# Patient Record
Sex: Female | Born: 1945 | Race: Black or African American | Hispanic: No | Smoking: Current every day smoker
Health system: Southern US, Community
[De-identification: ages and names within clinical notes are randomized; demographics above are authoritative.]

## PROBLEM LIST (undated history)

## (undated) DIAGNOSIS — F32A Depression, unspecified: Secondary | ICD-10-CM

## (undated) DIAGNOSIS — I10 Essential (primary) hypertension: Secondary | ICD-10-CM

## (undated) DIAGNOSIS — E785 Hyperlipidemia, unspecified: Secondary | ICD-10-CM

## (undated) DIAGNOSIS — R519 Headache, unspecified: Secondary | ICD-10-CM

## (undated) DIAGNOSIS — R51 Headache: Secondary | ICD-10-CM

## (undated) DIAGNOSIS — F329 Major depressive disorder, single episode, unspecified: Secondary | ICD-10-CM

## (undated) DIAGNOSIS — M1990 Unspecified osteoarthritis, unspecified site: Secondary | ICD-10-CM

## (undated) HISTORY — DX: Unspecified osteoarthritis, unspecified site: M19.90

## (undated) HISTORY — DX: Hyperlipidemia, unspecified: E78.5

## (undated) HISTORY — DX: Headache: R51

## (undated) HISTORY — DX: Headache, unspecified: R51.9

## (undated) HISTORY — DX: Depression, unspecified: F32.A

## (undated) HISTORY — DX: Major depressive disorder, single episode, unspecified: F32.9

---

## 1978-02-28 HISTORY — PX: APPENDECTOMY: SHX54

## 1978-02-28 HISTORY — PX: ABDOMINAL HYSTERECTOMY: SHX81

## 2005-06-10 ENCOUNTER — Ambulatory Visit (HOSPITAL_COMMUNITY): Admission: RE | Admit: 2005-06-10 | Discharge: 2005-06-10 | Payer: Self-pay | Admitting: Cardiology

## 2005-07-04 ENCOUNTER — Encounter: Admission: RE | Admit: 2005-07-04 | Discharge: 2005-07-04 | Payer: Self-pay | Admitting: Cardiology

## 2007-09-07 ENCOUNTER — Emergency Department (HOSPITAL_COMMUNITY): Admission: EM | Admit: 2007-09-07 | Discharge: 2007-09-07 | Payer: Self-pay | Admitting: Emergency Medicine

## 2008-06-10 ENCOUNTER — Encounter: Admission: RE | Admit: 2008-06-10 | Discharge: 2008-06-10 | Payer: Self-pay | Admitting: Cardiology

## 2009-02-28 HISTORY — PX: ANKLE FRACTURE SURGERY: SHX122

## 2009-04-29 ENCOUNTER — Encounter: Admission: RE | Admit: 2009-04-29 | Discharge: 2009-04-29 | Payer: Self-pay | Admitting: Cardiology

## 2009-10-29 ENCOUNTER — Encounter: Admission: RE | Admit: 2009-10-29 | Discharge: 2009-10-29 | Payer: Self-pay | Admitting: Cardiology

## 2010-06-10 ENCOUNTER — Other Ambulatory Visit: Payer: Self-pay | Admitting: Cardiology

## 2010-06-10 DIAGNOSIS — N6320 Unspecified lump in the left breast, unspecified quadrant: Secondary | ICD-10-CM

## 2010-06-15 ENCOUNTER — Ambulatory Visit
Admission: RE | Admit: 2010-06-15 | Discharge: 2010-06-15 | Disposition: A | Discharge status: Home / Self Care | Payer: MEDICARE | Source: Ambulatory Visit | Attending: Cardiology | Admitting: Cardiology

## 2010-06-15 DIAGNOSIS — N6320 Unspecified lump in the left breast, unspecified quadrant: Secondary | ICD-10-CM

## 2010-06-28 ENCOUNTER — Emergency Department (HOSPITAL_COMMUNITY): Payer: Medicare Other

## 2010-06-28 ENCOUNTER — Emergency Department (HOSPITAL_COMMUNITY)
Admission: EM | Admit: 2010-06-28 | Discharge: 2010-06-28 | Disposition: A | Discharge status: Home / Self Care | Payer: Medicare Other | Attending: Emergency Medicine | Admitting: Emergency Medicine

## 2010-06-28 DIAGNOSIS — S52599A Other fractures of lower end of unspecified radius, initial encounter for closed fracture: Secondary | ICD-10-CM | POA: Insufficient documentation

## 2010-06-28 DIAGNOSIS — E7800 Pure hypercholesterolemia, unspecified: Secondary | ICD-10-CM | POA: Insufficient documentation

## 2010-06-28 DIAGNOSIS — S82853A Displaced trimalleolar fracture of unspecified lower leg, initial encounter for closed fracture: Secondary | ICD-10-CM | POA: Insufficient documentation

## 2010-06-28 DIAGNOSIS — W010XXA Fall on same level from slipping, tripping and stumbling without subsequent striking against object, initial encounter: Secondary | ICD-10-CM | POA: Insufficient documentation

## 2010-06-28 DIAGNOSIS — I10 Essential (primary) hypertension: Secondary | ICD-10-CM | POA: Insufficient documentation

## 2010-06-28 DIAGNOSIS — F319 Bipolar disorder, unspecified: Secondary | ICD-10-CM | POA: Insufficient documentation

## 2010-06-28 LAB — CBC
HCT: 41.5 % (ref 36.0–46.0)
Hemoglobin: 13.5 g/dL (ref 12.0–15.0)
MCH: 29.9 pg (ref 26.0–34.0)
MCHC: 32.5 g/dL (ref 30.0–36.0)
MCV: 92 fL (ref 78.0–100.0)
Platelets: 286 K/uL (ref 150–400)
RBC: 4.51 MIL/uL (ref 3.87–5.11)
RDW: 13.1 % (ref 11.5–15.5)
WBC: 6 K/uL (ref 4.0–10.5)

## 2010-06-28 LAB — BASIC METABOLIC PANEL
CO2: 23 mEq/L (ref 19–32)
Creatinine, Ser: 0.66 mg/dL (ref 0.4–1.2)
GFR calc non Af Amer: >60 mL/min (ref >60)
Glucose, Bld: 109 mg/dL — ABNORMAL HIGH (ref 70–99)
Potassium: 4 mEq/L (ref 3.5–5.1)

## 2010-06-28 LAB — ABO/RH: ABO/RH(D): O POS

## 2010-06-28 LAB — PROTIME-INR: Prothrombin Time: 13.4 seconds (ref 11.6–15.2)

## 2010-06-29 LAB — TYPE AND SCREEN
ABO/RH(D): O POS
Antibody Screen: NEGATIVE

## 2010-12-29 LAB — HM MAMMOGRAPHY: HM Mammogram: NORMAL

## 2011-07-15 ENCOUNTER — Emergency Department (INDEPENDENT_AMBULATORY_CARE_PROVIDER_SITE_OTHER)
Admission: EM | Admit: 2011-07-15 | Discharge: 2011-07-15 | Disposition: A | Discharge status: Home / Self Care | Payer: Medicare Other | Source: Home / Self Care | Attending: Family Medicine | Admitting: Family Medicine

## 2011-07-15 ENCOUNTER — Encounter (HOSPITAL_COMMUNITY): Payer: Self-pay

## 2011-07-15 DIAGNOSIS — L259 Unspecified contact dermatitis, unspecified cause: Secondary | ICD-10-CM

## 2011-07-15 HISTORY — DX: Essential (primary) hypertension: I10

## 2011-07-15 MED ORDER — TRIAMCINOLONE ACETONIDE 40 MG/ML IJ SUSP
40.0000 mg | Freq: Once | INTRAMUSCULAR | Status: AC
  Administered 2011-07-15: 40 mg via INTRAMUSCULAR

## 2011-07-15 MED ORDER — TRIAMCINOLONE ACETONIDE 40 MG/ML IJ SUSP
INTRAMUSCULAR | Status: AC
  Filled 2011-07-15: qty 5

## 2011-07-15 MED ORDER — METHYLPREDNISOLONE ACETATE 80 MG/ML IJ SUSP
INTRAMUSCULAR | Status: AC
  Filled 2011-07-15: qty 1

## 2011-07-15 MED ORDER — METHYLPREDNISOLONE ACETATE 40 MG/ML IJ SUSP
80.0000 mg | Freq: Once | INTRAMUSCULAR | Status: AC
  Administered 2011-07-15: 80 mg via INTRAMUSCULAR

## 2011-07-15 MED ORDER — METHYLPREDNISOLONE 4 MG PO KIT: PACK | ORAL | Status: AC

## 2011-07-15 MED ORDER — FUROSEMIDE 40 MG PO TABS: 40.0000 mg | ORAL_TABLET | Freq: Every day | ORAL | Status: DC

## 2011-07-15 MED ORDER — FLUTICASONE PROPIONATE 0.05 % EX CREA: TOPICAL_CREAM | Freq: Every day | CUTANEOUS | Status: AC

## 2011-07-15 NOTE — Discharge Instructions (Signed)
Use medicine as prescribed and see dermatologist for recheck.

## 2011-07-15 NOTE — ED Provider Notes (Signed)
History     CSN: [id number]  Arrival date & time 07/15/11  1212   First MD Initiated Contact with Patient 07/15/11 1221      Chief Complaint  Patient presents with  . Dermatitis    (Consider location/radiation/quality/duration/timing/severity/associated sxs/prior treatment) Patient is a 67 y.o. female presenting with rash. The history is provided by the patient.  Rash  This is a chronic problem. The current episode started more than 1 week ago. The problem has been gradually worsening. The problem is associated with an unknown factor. There has been no fever. The rash is present on the face, right hand, left hand, left wrist and right wrist. The patient is experiencing no pain. Associated symptoms include itching and weeping. Risk factors: chronic contact derm, no known exposure.    Past Medical History  Diagnosis Date  . Hypertension     History reviewed. No pertinent past surgical history.  History reviewed. No pertinent family history.  History  Substance Use Topics  . Smoking status: Current Everyday Smoker  . Smokeless tobacco: Not on file  . Alcohol Use: Yes    OB History    Grav Para Term Preterm Abortions TAB SAB Ect Mult Living                  Review of Systems  Constitutional: Negative.   HENT: Negative for sore throat and trouble swallowing.   Respiratory: Negative.   Skin: Positive for itching and rash.  Psychiatric/Behavioral: Negative.     Allergies  Nickel  Home Medications   Current Outpatient Rx  Name Route Sig Dispense Refill  . AMLODIPINE BESYLATE-VALSARTAN 5-160 MG PO TABS Oral Take 1 tablet by mouth daily.    . BUPROPION HCL 75 MG PO TABS Oral Take 75 mg by mouth 2 (two) times daily.    . SERTRALINE HCL 25 MG PO TABS Oral Take 25 mg by mouth daily.    Marland Kitchen FLUTICASONE PROPIONATE 0.05 % EX CREA Topical Apply topically daily. 60 g 0  . FUROSEMIDE 40 MG PO TABS Oral Take 1 tablet (40 mg total) by mouth daily. For swelling from medication.  30 tablet 0  . METHYLPREDNISOLONE 4 MG PO KIT  follow package directions, start on Sunday 5/19 21 tablet 0    BP 172/100  Pulse 98  Temp(Src) 98.2 F (36.8 C) (Oral)  Resp 18  SpO2 99%  Physical Exam  Nursing note and vitals reviewed. Constitutional: She is oriented to person, place, and time. She appears well-developed and well-nourished.  HENT:  Head: Normocephalic.  Mouth/Throat: Oropharynx is clear and moist.  Neck: Normal range of motion. Neck supple.  Cardiovascular: Normal heart sounds.   Pulmonary/Chest: Breath sounds normal.  Lymphadenopathy:    She has no cervical adenopathy.  Neurological: She is alert and oriented to person, place, and time.  Skin: Skin is warm and dry. Rash noted.       Facial swelling ,mild erythema, cracking peeling dermatitis of forehead and hands, wrists bilat.  Psychiatric: She has a normal mood and affect.    ED Course  Procedures (including critical care time)  Labs Reviewed - No data to display No results found.   1. Chronic contact dermatitis       MDM          Linna Hoff, MD 07/15/11 1324

## 2011-07-15 NOTE — ED Notes (Signed)
Reports she has a chronic contact dermatitis, and cannot tolerate certain chemicals or nickel; most recent episode x 1 week, both arms dry and scaly, face reddened , swollen ; looks uncomfortable

## 2012-10-06 ENCOUNTER — Emergency Department (INDEPENDENT_AMBULATORY_CARE_PROVIDER_SITE_OTHER)
Admission: EM | Admit: 2012-10-06 | Discharge: 2012-10-06 | Disposition: A | Discharge status: Home / Self Care | Payer: Medicare Other | Source: Home / Self Care

## 2012-10-06 ENCOUNTER — Encounter (HOSPITAL_COMMUNITY): Payer: Self-pay | Admitting: Emergency Medicine

## 2012-10-06 DIAGNOSIS — M25569 Pain in unspecified knee: Secondary | ICD-10-CM

## 2012-10-06 LAB — POCT I-STAT, CHEM 8
BUN: 9 mg/dL (ref 6–23)
Calcium, Ion: 1.25 mmol/L (ref 1.13–1.30)
Chloride: 106 mEq/L (ref 96–112)
Creatinine, Ser: 0.9 mg/dL (ref 0.50–1.10)
Glucose, Bld: 152 mg/dL — ABNORMAL HIGH (ref 70–99)
HCT: 46 % (ref 36.0–46.0)
Hemoglobin: 15.6 g/dL — ABNORMAL HIGH (ref 12.0–15.0)
Potassium: 3.9 mEq/L (ref 3.5–5.1)
Sodium: 142 mEq/L (ref 135–145)
TCO2: 26 mmol/L (ref 0–100)

## 2012-10-06 MED ORDER — HYDROCODONE-ACETAMINOPHEN 5-325 MG PO TABS
ORAL_TABLET | ORAL | Status: AC
  Filled 2012-10-06: qty 2

## 2012-10-06 MED ORDER — TRAMADOL HCL 50 MG PO TABS: 50.0000 mg | ORAL_TABLET | Freq: Four times a day (QID) | ORAL | Status: DC | PRN

## 2012-10-06 MED ORDER — HYDROCODONE-ACETAMINOPHEN 5-325 MG PO TABS
2.0000 | ORAL_TABLET | Freq: Once | ORAL | Status: AC
  Administered 2012-10-06: 2 via ORAL

## 2012-10-06 NOTE — ED Provider Notes (Signed)
Brittney Lang is a 67 y.o. female who presents to Urgent Care today for bilateral medial knee pain and swelling for the last week without injury. Patient has tried over-the-counter pain medications which have not helped. The pain is moderate to severe does not radiate. She denies any radiating pain weakness or numbness. She normally ambulates with a cane. She was last seen by her primary care provider over one year ago. She does have an orthopedic doctor agrees for orthopedics who cared for her right ankle fracture years ago. She is unaware of any diagnosis of gout or osteoarthritis. She is well otherwise.    PMH reviewed. Hypertension, right ankle fracture History  Substance Use Topics  . Smoking status: Current Every Day Smoker  . Smokeless tobacco: Not on file  . Alcohol Use: Yes   ROS as above Medications reviewed. No current facility-administered medications for this encounter.   Current Outpatient Prescriptions  Medication Sig Dispense Refill  . amLODipine-valsartan (EXFORGE) 5-160 MG per tablet Take 1 tablet by mouth daily.      Marland Kitchen buPROPion (WELLBUTRIN) 75 MG tablet Take 75 mg by mouth 2 (two) times daily.      . furosemide (LASIX) 40 MG tablet Take 1 tablet (40 mg total) by mouth daily. For swelling from medication.  30 tablet  0  . sertraline (ZOLOFT) 25 MG tablet Take 25 mg by mouth daily.      . traMADol (ULTRAM) 50 MG tablet Take 1 tablet (50 mg total) by mouth every 6 (six) hours as needed for pain.  20 tablet  0    Exam:  BP 160/85  Pulse 99  Temp(Src) 98.8 F (37.1 C) (Oral)  Resp 19  SpO2 96% Gen: Well NAD HEENT: EOMI,  MMM Lungs: CTABL Nl WOB Heart: RRR no MRG Abd: NABS, NT, ND Exts: Non edematous BL  LE, warm and well perfused.  Knees bilaterally: Moderate effusion tender palpation medial joint line Range of motion 0-100 Positive McMurray's negative Lachman's valgus and varus stress.  \Capillary refill sensation are intact distal bilateral extremity.     Results for orders placed during the hospital encounter of 10/06/12 (from the past 24 hour(s))  POCT I-STAT, CHEM 8     Status: Abnormal   Collection Time    10/06/12  5:01 PM      Result Value Range   Sodium 142  135 - 145 mEq/L   Potassium 3.9  3.5 - 5.1 mEq/L   Chloride 106  96 - 112 mEq/L   BUN 9  6 - 23 mg/dL   Creatinine, Ser 1.61  0.50 - 1.10 mg/dL   Glucose, Bld 096 (*) 70 - 99 mg/dL   Calcium, Ion 0.45  4.09 - 1.30 mmol/L   TCO2 26  0 - 100 mmol/L   Hemoglobin 15.6 (*) 12.0 - 15.0 g/dL   HCT 81.1  91.4 - 78.2 %   No results found.  Assessment and Plan: 67 y.o. 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  Plan: Uric acid and istat chem 8 labs today.  Empiric treatment with tramadol for pain.  Followup with orthopedics Monday or Tuesday.      Rodolph Bong, MD 10/06/12 [phone]

## 2012-10-06 NOTE — ED Notes (Signed)
Pt c/o bilateral knee pain onset 1 week that's gradually getting worse Pain is constant w/intermittent sharp pains and increases w/activity... Hx of arthritis Denies: inj/trauma, strenuous activity... Taking advil w/no relief.  Alert w/no signs of acute distress.

## 2012-10-08 NOTE — ED Notes (Signed)
Patient called with questions about her treatment plan, as her joints remain painful and swollen. Instructions read for her to f/u w [facility]. Labs WNL for renal function and uric acid. Pt to call for appointment this AM

## 2012-11-15 ENCOUNTER — Ambulatory Visit (INDEPENDENT_AMBULATORY_CARE_PROVIDER_SITE_OTHER): Payer: 59 | Admitting: Nurse Practitioner

## 2012-11-15 ENCOUNTER — Encounter: Payer: Self-pay | Admitting: Nurse Practitioner

## 2012-11-15 VITALS — BP 138/86 | HR 91 | Temp 99.1°F | Resp 12 | Ht 62.03 in | Wt 178.0 lb

## 2012-11-15 DIAGNOSIS — M1990 Unspecified osteoarthritis, unspecified site: Secondary | ICD-10-CM | POA: Insufficient documentation

## 2012-11-15 DIAGNOSIS — M129 Arthropathy, unspecified: Secondary | ICD-10-CM

## 2012-11-15 DIAGNOSIS — E785 Hyperlipidemia, unspecified: Secondary | ICD-10-CM | POA: Insufficient documentation

## 2012-11-15 DIAGNOSIS — F329 Major depressive disorder, single episode, unspecified: Secondary | ICD-10-CM

## 2012-11-15 DIAGNOSIS — I10 Essential (primary) hypertension: Secondary | ICD-10-CM

## 2012-11-15 DIAGNOSIS — K047 Periapical abscess without sinus: Secondary | ICD-10-CM

## 2012-11-15 DIAGNOSIS — F319 Bipolar disorder, unspecified: Secondary | ICD-10-CM | POA: Insufficient documentation

## 2012-11-15 MED ORDER — CLINDAMYCIN HCL 300 MG PO CAPS: 300.0000 mg | ORAL_CAPSULE | Freq: Four times a day (QID) | ORAL | Status: DC

## 2012-11-15 NOTE — Progress Notes (Signed)
Patient ID: Brittney Lang, female   DOB: [birth year], 67 y.o.   MRN: [id number]   Allergies  Allergen Reactions  . Sulfur     Unable to recall type pf reaction   . Nickel     Chief Complaint  Patient presents with  . Establish Care    New Patient Establish: Medication management- out of all medications x 1 year- patient ran out and did request refills   . Knee Problem    bilateral knee pain x 1-2 months, no known injury   . Dental Pain    right side tooth pain- onset yesterday, right side of face swollen     HPI: Patient is a 67 y.o. female seen in the office today to establish care.  Yesterday early was eating a pork chop and afterwards  pt noticed swelling and pain in the right side of her mouth and face. History of dental carries and teeth removed; does not have a dentist due to the fact she can not afford to go.. Does not do routine mouth or dental care. Has not notice fever or chills. No trouble swallowing or neck pain.   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 Tylenol as help some. Review of Systems:   Review of Systems  Constitutional: Positive for fever, weight loss (was 185 lbs 1 month ago) and malaise/fatigue. Negative for chills.  HENT: Negative for hearing loss, ear pain, nosebleeds, congestion, sore throat, neck pain, tinnitus and ear discharge.   Eyes: Negative for blurred vision, double vision, pain and discharge.       Has not seen eye doctor in 2 years; hx of cataracts   Respiratory: Negative for cough, shortness of breath and wheezing.   Cardiovascular: Positive for leg swelling. Negative for chest pain and palpitations.  Gastrointestinal: Positive for heartburn. Negative for nausea, vomiting, abdominal pain, diarrhea, constipation, blood in stool and melena.   Genitourinary: Positive for urgency and frequency. Negative for dysuria.       Has episodes of urge incontience  Musculoskeletal: Positive for joint pain. Negative for myalgias, back pain and falls (fell down hill and broke right ankle and right arm- 2 years ago).  Skin:       Contact dermatitis when she uses certain soaps  Neurological: Positive for weakness and headaches (couple of days ago; last several hours- 1 day; advil helps). Negative for dizziness, tingling and tremors.  Psychiatric/Behavioral: Positive for depression and memory loss. Negative for suicidal ideas and hallucinations. The patient is nervous/anxious and has insomnia.      Past Medical History  Diagnosis Date  . Hypertension   . Hyperlipemia   . Generalized headaches   . Arthritis   . Depression    Past Surgical History  Procedure Laterality Date  . Abdominal hysterectomy  1980  . Ankle fracture surgery  2011    Left   . Appendectomy  1980   Social History:   reports that she has been smoking Cigarettes.  She has a 15 pack-year smoking history. She does not have any smokeless tobacco history on file. She reports that  drinks alcohol. She reports that she does not use illicit drugs.  Family History  Problem Relation Age of Onset  . Cancer Mother   . Heart disease Mother   .  Hypertension Mother   . Hyperlipidemia Mother   . Diabetes Other     Grand-daughter  . Depression Sister   . Depression Sister     Medications: Patient's Medications  New Prescriptions   No medications on file  Previous Medications   AMLODIPINE-VALSARTAN (EXFORGE) 5-160 MG PER TABLET    Take 1 tablet by mouth daily.   ARIPIPRAZOLE (ABILIFY) 10 MG TABLET    Take 10 mg by mouth daily.   BUPROPION (WELLBUTRIN XL) 150 MG 24 HR TABLET    Take 150 mg by mouth. 2 by mouth in the am, 1 by mouth at 2 pm   CLONAZEPAM (KLONOPIN) 1 MG TABLET    Take 1 mg by mouth 2 (two) times daily as needed for anxiety.   DIAZEPAM (VALIUM) 10 MG TABLET     Take 10 mg by mouth 2 (two) times daily.   SERTRALINE (ZOLOFT) 100 MG TABLET    Take 100 mg by mouth daily.  Modified Medications   No medications on file  Discontinued Medications   BUPROPION (WELLBUTRIN) 75 MG TABLET    Take 75 mg by mouth 2 (two) times daily.   FUROSEMIDE (LASIX) 40 MG TABLET    Take 1 tablet (40 mg total) by mouth daily. For swelling from medication.   SERTRALINE (ZOLOFT) 25 MG TABLET    Take 25 mg by mouth daily.   TRAMADOL (ULTRAM) 50 MG TABLET    Take 1 tablet (50 mg total) by mouth every 6 (six) hours as needed for pain.     Physical Exam:  Filed Vitals:   11/15/12 0857  BP: 138/86  Pulse: 91  Temp: 99.1 F (37.3 C)  TempSrc: Oral  Resp: 12  Height: 5' 2.03" (1.575 m)  Weight: 178 lb (80.74 kg)  SpO2: 99%    Physical Exam  Constitutional: She is oriented to person, place, and time. She appears well-developed. No distress.  HENT:  Right Ear: External ear normal.  Left Ear: External ear normal.  Mouth/Throat: Uvula is midline and oropharynx is clear and moist. Abnormal dentition. Dental abscesses and dental caries present. No oropharyngeal exudate.  Bad dentition throughout mouth; single tooth on top right side of mouth; tender gums and tooth when touched; no drainage noted  Neck: Normal range of motion. Neck supple. No thyromegaly present.  Cardiovascular: Normal rate, regular rhythm and normal heart sounds.   Pulmonary/Chest: Effort normal and breath sounds normal. No respiratory distress.  Abdominal: Soft. Bowel sounds are normal. She exhibits no distension.  Musculoskeletal: Normal range of motion. She exhibits tenderness (to bilateral knees; bilaterally no swelling or effusions appreciated on exam).  Lymphadenopathy:    She has no cervical adenopathy.  Neurological: She is alert and oriented to person, place, and time.  Skin: Skin is warm and dry. She is not diaphoretic.  Psychiatric: She has a normal mood and affect.     Labs  reviewed: Basic Metabolic Panel:  Recent Labs  45/40/98 1701  NA 142  K 3.9  CL 106  GLUCOSE 152*  BUN 9  CREATININE 0.90   Liver Function Tests: No results found for this basename: AST, ALT, ALKPHOS, BILITOT, PROT, ALBUMIN,  in the last 8760 hours No results found for this basename: LIPASE, AMYLASE,  in the last 8760 hours No results found for this basename: AMMONIA,  in the last 8760 hours CBC:  Recent Labs  10/06/12 1701  HGB 15.6*  HCT 46.0    Assessment/Plan 1. Depression Pt previously on multiple  medications and managed at a behavioral clinic; has since been off all medications for over a year but has appt with Dr Donell Beers on 12/07/12  2. Arthritis Worsening in bilateral knee; no effusion or crepitus noted; went to urgent care and was given tramadol which did not helped; has been using muscle rub and can use tylenol 1000 mg every 8 hours as needed for pain  3. Hyperlipemia Not currently on medications; will get lab work today - Lipid panel - Comprehensive metabolic panel  4. Hypertension Not currently on any medication; will get blood work today   5. Dental abscess Pt education on when to go the ED if symptoms worsen pt understand Pt to use baking soda and water mixture to brush teeth and swish and spit 3 times daily - clindamycin (CLEOCIN) 300 MG capsule; Take 1 capsule (300 mg total) by mouth 4 (four) times daily.  Dispense: 40 capsule; Refill: 0 - CBC With differential/Platelet  Will follow up with pt in 4 days regarding dental abscess; educated to seek a dentist

## 2012-11-15 NOTE — Patient Instructions (Addendum)
Brush teeth three times a day with baking soda and water May swish and spit mixture   Follow up on Monday   Dental Abscess A dental abscess is a collection of infected fluid (pus) from a bacterial infection in the inner part of the tooth (pulp). It usually occurs at the end of the tooth's root.  CAUSES   Severe tooth decay.  Trauma to the tooth that allows bacteria to enter into the pulp, such as a broken or chipped tooth. SYMPTOMS   Severe pain in and around the infected tooth.  Swelling and redness around the abscessed tooth or in the mouth or face.  Tenderness.  Pus drainage.  Bad breath.  Bitter taste in the mouth.  Difficulty swallowing.  Difficulty opening the mouth.  Nausea.  Vomiting.  Chills.  Swollen neck glands. DIAGNOSIS   A medical and dental history will be taken.  An examination will be performed by tapping on the abscessed tooth.  X-rays may be taken of the tooth to identify the abscess. TREATMENT The goal of treatment is to eliminate the infection. You may be prescribed antibiotic medicine to stop the infection from spreading. A root canal may be performed to save the tooth. If the tooth cannot be saved, it may be pulled (extracted) and the abscess may be drained.  HOME CARE INSTRUCTIONS  Only take over-the-counter or prescription medicines for pain, fever, or discomfort as directed by your caregiver.  Rinse your mouth (gargle) often with salt water ( tsp salt in 8 oz [250 ml] of warm water) to relieve pain or swelling.  Do not drive after taking pain medicine (narcotics).  Do not apply heat to the outside of your face.  Return to your dentist for further treatment as directed. SEEK MEDICAL CARE IF:  Your pain is not helped by medicine.  Your pain is getting worse instead of better. SEEK IMMEDIATE MEDICAL CARE IF:  You have a fever or persistent symptoms for more than 2 3 days.  You have a fever and your symptoms suddenly get  worse.  You have chills or a very bad headache.  You have problems breathing or swallowing.  You have trouble opening your mouth.  You have swelling in the neck or around the eye. Document Released: 02/14/2005 Document Revised: 11/09/2011 Document Reviewed: 05/25/2010 [facility] Patient Information 2014 [place], [place].

## 2012-11-16 LAB — CBC WITH DIFFERENTIAL
Basophils Absolute: 0 10*3/uL (ref 0.0–0.2)
Basos: 0 %
Eosinophils Absolute: 0.3 10*3/uL (ref 0.0–0.4)
HCT: 43.6 % (ref 34.0–46.6)
Hemoglobin: 15 g/dL (ref 11.1–15.9)
Lymphs: 21 %
MCH: 29.9 pg (ref 26.6–33.0)
MCHC: 34.4 g/dL (ref 31.5–35.7)
Monocytes Absolute: 0.6 10*3/uL (ref 0.1–0.9)
Neutrophils Absolute: 4.6 10*3/uL (ref 1.4–7.0)
RDW: 14.1 % (ref 12.3–15.4)

## 2012-11-16 LAB — COMPREHENSIVE METABOLIC PANEL
Albumin: 4.3 g/dL (ref 3.6–4.8)
Alkaline Phosphatase: 106 IU/L (ref 39–117)
BUN: 10 mg/dL (ref 8–27)
CO2: 25 mmol/L (ref 18–29)
Calcium: 10 mg/dL (ref 8.6–10.2)
Chloride: 99 mmol/L (ref 97–108)
Creatinine, Ser: 0.71 mg/dL (ref 0.57–1.00)
Globulin, Total: 2.3 g/dL (ref 1.5–4.5)
Glucose: 101 mg/dL — ABNORMAL HIGH (ref 65–99)
Total Protein: 6.6 g/dL (ref 6.0–8.5)

## 2012-11-16 LAB — LIPID PANEL
Chol/HDL Ratio: 7 ratio units — ABNORMAL HIGH (ref 0.0–4.4)
LDL Calculated: 160 mg/dL — ABNORMAL HIGH (ref 0–99)
VLDL Cholesterol Cal: 38 mg/dL (ref 5–40)

## 2012-11-19 ENCOUNTER — Ambulatory Visit (INDEPENDENT_AMBULATORY_CARE_PROVIDER_SITE_OTHER): Payer: 59 | Admitting: Nurse Practitioner

## 2012-11-19 ENCOUNTER — Encounter: Payer: Self-pay | Admitting: Nurse Practitioner

## 2012-11-19 VITALS — BP 140/92 | HR 91 | Temp 98.9°F | Wt 179.0 lb

## 2012-11-19 DIAGNOSIS — K047 Periapical abscess without sinus: Secondary | ICD-10-CM

## 2012-11-19 DIAGNOSIS — I10 Essential (primary) hypertension: Secondary | ICD-10-CM

## 2012-11-19 DIAGNOSIS — E785 Hyperlipidemia, unspecified: Secondary | ICD-10-CM

## 2012-11-19 DIAGNOSIS — G4700 Insomnia, unspecified: Secondary | ICD-10-CM

## 2012-11-19 MED ORDER — VALSARTAN 160 MG PO TABS: 160.0000 mg | ORAL_TABLET | Freq: Every day | ORAL | Status: DC

## 2012-11-19 MED ORDER — PRAVASTATIN SODIUM 40 MG PO TABS: ORAL_TABLET | ORAL | Status: DC

## 2012-11-19 NOTE — Progress Notes (Signed)
Patient ID: Brittney Lang, female   DOB: [birth year], 67 y.o.   MRN: [id number]   Allergies  Allergen Reactions  . Sulfur     Unable to recall type pf reaction   . Nickel     Chief Complaint  Patient presents with  . Follow-up    4 day follow-up, discuss labs (copy printed), refill medications   . Hypertension    discuss starting b/p medicaiton     HPI: Patient is a 67 y.o. female seen in the office today to follow up on dental abscess; Taking antibiotic QID  and baking soda rinse twice dailysome diarrhea noted from taking medication but "not bad"; pain and swelling has improved; no fevers or chills, no headaches, no drainage from tooth  Would like something to help her sleep at night has insomnia where she only will stay asleep a few hours at night and wakes up frequently  Lab worked discussed; pt reports she is unable to adhere to a heart healthy diet and exercise is hard due to her knee pain   Review of Systems:  Review of Systems  Constitutional: Negative for fever, chills and malaise/fatigue.  HENT: Negative for hearing loss, ear pain, congestion and sore throat.   Respiratory: Negative for shortness of breath.   Cardiovascular: Negative for chest pain and palpitations.  Neurological: Negative for weakness and headaches.     Past Medical History  Diagnosis Date  . Hypertension   . Hyperlipemia   . Generalized headaches   . Arthritis   . Depression    Past Surgical History  Procedure Laterality Date  . Abdominal hysterectomy  1980  . Ankle fracture surgery  2011    Left   . Appendectomy  1980   Social History:   reports that she has been smoking Cigarettes.  She has a 15 pack-year smoking history. She does not have any smokeless tobacco history on file. She reports that  drinks alcohol. She reports that she does not use illicit drugs.  Family History  Problem Relation Age of Onset  . Cancer Mother   . Heart disease Mother   . Hypertension Mother   .  Hyperlipidemia Mother   . Diabetes Other     Grand-daughter  . Depression Sister   . Depression Sister     Medications: Patient's Medications  New Prescriptions   No medications on file  Previous Medications   CLINDAMYCIN (CLEOCIN) 300 MG CAPSULE    Take 1 capsule (300 mg total) by mouth 4 (four) times daily.  Modified Medications   No medications on file  Discontinued Medications   No medications on file     Physical Exam:  Filed Vitals:   11/19/12 1412  BP: 140/92  Pulse: 91  Temp: 98.9 F (37.2 C)  TempSrc: Oral  Weight: 179 lb (81.194 kg)  SpO2: 99%    Constitutional: She is oriented to person, place, and time. She appears well-developed. No distress.  HENT:  Right Ear: External ear normal.  Left Ear: External ear normal.  Mouth/Throat: Uvula is midline and oropharynx is clear and moist. Abnormal dentition. Dental abscesses and dental caries present. No oropharyngeal exudate.  Bad dentition throughout mouth; single tooth on top right side of mouth; swelling noted; tender gums and tooth when touched; no drainage noted  Neck: Normal range of motion. Neck supple. No thyromegaly present. Enlarged right sided Lymphnode Cardiovascular: Normal rate, regular rhythm and normal heart sounds.  Pulmonary/Chest: Effort normal and breath sounds normal. No respiratory  distress.  Abdominal: Soft. Bowel sounds are normal. She exhibits no distension.    Labs reviewed: Basic Metabolic Panel:  Recent Labs  16/10/96 1701 11/15/12 1011  NA 142 142  K 3.9 4.0  CL 106 99  CO2  --  25  GLUCOSE 152* 101*  BUN 9 10  CREATININE 0.90 0.71  CALCIUM  --  10.0   Liver Function Tests:  Recent Labs  11/15/12 1011  AST 15  ALT 15  ALKPHOS 106  BILITOT 0.3  PROT 6.6   No results found for this basename: LIPASE, AMYLASE,  in the last 8760 hours No results found for this basename: AMMONIA,  in the last 8760 hours CBC:  Recent Labs  10/06/12 1701 11/15/12 1011  WBC  --   7.0  NEUTROABS  --  4.6  HGB 15.6* 15.0  HCT 46.0 43.6  MCV  --  87  PLT  --  329   Lipid Panel:  Recent Labs  11/15/12 1011  HDL 33*  LDLCALC 160*  TRIG 191*  CHOLHDL 7.0*    Assessment/Plan 1. Abscessed tooth Improved to cont full course of antibiotic; reports she can not afford to go to the dentist; educated she needs to see dentist regarding tooth; to call heath department to see when next dental clinic is; to follow up if swelling/pain gets worse or if she develops fever  2. Insomnia Encouraged pt to have a routine schedule at night  Turn off all lights- cool dark room Decrease naps during the day Increase activity (pool for 30 mins/5 days a week) Take melatonin 3 mg at night to help sleep   3. Other and unspecified hyperlipidemia Will start statin today encouraged heart healthy diet and exercise for 46mins/5 days a week  - pravastatin (PRAVACHOL) 40 MG tablet; Take 1/2 tablet (20 mg) for 2 weeks then increase in 1 tablet daily at night  Dispense: 30 tablet; Refill: 3  4. Essential hypertension, benign Will start valsartan; encouraged heart healthy; low sodium diet and exercise for 86mins/5 days a week  - valsartan (DIOVAN) 160 MG tablet; Take 1 tablet (160 mg total) by mouth daily.  Dispense: 30 tablet; Refill: 3   To follow up in 1 month for EV

## 2012-11-19 NOTE — Patient Instructions (Addendum)
Call health department and see when the next dental clinic is taking place You need to make sure to take care of this because it will come back  For Sleep Turn off all lights- cool dark room Have a routine schedule at night  Decrease naps during the day Increase activity (pool for 30 mins/5 days a week) Take melatonin 3 mg at night to help sleep   Cholesterol  Will start statin today (faxed to pharmacy)  Blood pressure Will start medications valsartan to take daily   For blood pressure and cholesterol we recommend  Heart healthy; low sodium diet  Also exercise 30 mins/5 days a week  Cardiac Diet This diet can help prevent heart disease and stroke. Many factors influence your heart health, including eating and exercise habits. Coronary risk rises a lot with abnormal blood fat (lipid) levels. Cardiac meal planning includes limiting unhealthy fats, increasing healthy fats, and making other small dietary changes. General guidelines are as follows:  Adjust calorie intake to reach and maintain desirable body weight.  Limit total fat intake to less than 30% of total calories. Saturated fat should be less than 7% of calories.  Saturated fats are found in animal products and in some vegetable products. Saturated vegetable fats are found in coconut oil, cocoa butter, palm oil, and palm kernel oil. Read labels carefully to avoid these products as much as possible. Use butter in moderation. Choose tub margarines and oils that have 2 grams of fat or less. Good cooking oils are canola and olive oils.  Practice low-fat cooking techniques. Do not fry food. Instead, broil, bake, boil, steam, grill, roast on a rack, stir-fry, or microwave it. Other fat reducing suggestions include:  Remove the skin from poultry.  Remove all visible fat from meats.  Skim the fat off stews, soups, and gravies before serving them.  Steam vegetables in water or broth instead of sauting them in fat.  Avoid foods  with trans fat (or hydrogenated oils), such as commercially fried foods and commercially baked goods. Commercial shortening and deep-frying fats will contain trans fat.  Increase intake of fruits, vegetables, whole grains, and legumes to replace foods high in fat.  Increase consumption of nuts, legumes, and seeds to at least 4 servings weekly. One serving of a legume equals  cup, and 1 serving of nuts or seeds equals  cup.  Choose whole grains more often. Have 3 servings per day (a serving is 1 ounce [oz]).  Eat 4 to 5 servings of vegetables per day. A serving of vegetables is 1 cup of raw leafy vegetables;  cup of raw or cooked cut-up vegetables;  cup of vegetable juice.  Eat 4 to 5 servings of fruit per day. A serving of fruit is 1 medium whole fruit;  cup of dried fruit;  cup of fresh, frozen, or canned fruit;  cup of 100% fruit juice.  Increase your intake of dietary fiber to 20 to 30 grams per day. Insoluble fiber may help lower your risk of heart disease and may help curb your appetite.  Soluble fiber binds cholesterol to be removed from the blood. Foods high in soluble fiber are dried beans, citrus fruits, oats, apples, bananas, broccoli, Brussels sprouts, and eggplant.  Try to include foods fortified with plant sterols or stanols, such as yogurt, breads, juices, or margarines. Choose several fortified foods to achieve a daily intake of 2 to 3 grams of plant sterols or stanols.  Foods with omega-3 fats can help reduce your  risk of heart disease. Aim to have a 3.5 oz portion of fatty fish twice per week, such as salmon, mackerel, albacore tuna, sardines, lake trout, or herring. If you wish to take a fish oil supplement, choose one that contains 1 gram of both DHA and EPA.  Limit processed meats to 2 servings (3 oz portion) weekly.  Limit the sodium in your diet to 1500 milligrams (mg) per day. If you have high blood pressure, talk to a registered dietitian about a DASH (Dietary  Approaches to Stop Hypertension) eating plan.  Limit sweets and beverages with added sugar, such as soda, to no more than 5 servings per week. One serving is:   1 tablespoon sugar.  1 tablespoon jelly or jam.   cup sorbet.  1 cup lemonade.   cup regular soda. CHOOSING FOODS Starches  Allowed: Breads: All kinds (wheat, rye, raisin, white, oatmeal, [place], [place], and English muffin bread). Low-fat rolls: English muffins, frankfurter and hamburger buns, bagels, pita bread, tortillas (not fried). Pancakes, waffles, biscuits, and muffins made with recommended oil.  Avoid: Products made with saturated or trans fats, oils, or whole milk products. Butter rolls, cheese breads, croissants. Commercial doughnuts, muffins, sweet rolls, biscuits, waffles, pancakes, store-bought mixes. Crackers  Allowed: Low-fat crackers and snacks: Animal, graham, rye, saltine (with recommended oil, no lard), oyster, and matzo crackers. Bread sticks, melba toast, rusks, flatbread, pretzels, and light popcorn.  Avoid: High-fat crackers: cheese crackers, butter crackers, and those made with coconut, palm oil, or trans fat (hydrogenated oils). Buttered popcorn. Cereals  Allowed: Hot or cold whole-grain cereals.  Avoid: Cereals containing coconut, hydrogenated vegetable fat, or animal fat. Potatoes / Pasta / Rice  Allowed: All kinds of potatoes, rice, and pasta (such as macaroni, spaghetti, and noodles).  Avoid: Pasta or rice prepared with cream sauce or high-fat cheese. Chow mein noodles, [place] fries. Vegetables  Allowed: All vegetables and vegetable juices.  Avoid: Fried vegetables. Vegetables in cream, butter, or high-fat cheese sauces. Limit coconut. Fruit in cream or custard. Protein  Allowed: Limit your intake of meat, seafood, and poultry to no more than 6 oz (cooked weight) per day. All lean, well-trimmed beef, veal, pork, and lamb. All chicken and [place] without skin. All fish and shellfish.  Wild game: wild duck, rabbit, pheasant, and venison. Egg whites or low-cholesterol egg substitutes may be used as desired. Meatless dishes: recipes with dried beans, peas, lentils, and tofu (soybean curd). Seeds and nuts: all seeds and most nuts.  Avoid: Prime grade and other heavily marbled and fatty meats, such as short ribs, spare ribs, rib eye roast or steak, frankfurters, sausage, bacon, and high-fat luncheon meats, mutton. Caviar. Commercially fried fish. Domestic duck, goose, venison sausage. Organ meats: liver, gizzard, heart, chitterlings, brains, kidney, sweetbreads. Dairy  Allowed: Low-fat cheeses: nonfat or low-fat cottage cheese (1% or 2% fat), cheeses made with part skim milk, such as mozzarella, farmers, string, or ricotta. (Cheeses should be labeled no more than 2 to 6 grams fat per oz.). Skim (or 1%) milk: liquid, powdered, or evaporated. Buttermilk made with low-fat milk. Drinks made with skim or low-fat milk or cocoa. Chocolate milk or cocoa made with skim or low-fat (1%) milk. Nonfat or low-fat yogurt.  Avoid: Whole milk cheeses, including colby, cheddar, [place], [address], [place], [place], [place], [address], Swiss, and blue. Creamed cottage cheese, cream cheese. Whole milk and whole milk products, including buttermilk or yogurt made from whole milk, drinks made from whole milk. Condensed milk, evaporated whole milk, and 2%  milk. Soups and Combination Foods  Allowed: Low-fat low-sodium soups: broth, dehydrated soups, homemade broth, soups with the fat removed, homemade cream soups made with skim or low-fat milk. Low-fat spaghetti, lasagna, chili, and Spanish rice if low-fat ingredients and low-fat cooking techniques are used.  Avoid: Cream soups made with whole milk, cream, or high-fat cheese. All other soups. Desserts and Sweets  Allowed: Sherbet, fruit ices, gelatins, meringues, and angel food cake. Homemade desserts with recommended fats, oils, and milk products. Jam,  jelly, honey, marmalade, sugars, and syrups. Pure sugar candy, such as gum drops, hard candy, jelly beans, marshmallows, mints, and small amounts of dark chocolate.  Avoid: Commercially prepared cakes, pies, cookies, frosting, pudding, or mixes for these products. Desserts containing whole milk products, chocolate, coconut, lard, palm oil, or palm kernel oil. Ice cream or ice cream drinks. Candy that contains chocolate, coconut, butter, hydrogenated fat, or unknown ingredients. Buttered syrups. Fats and Oils  Allowed: Vegetable oils: safflower, sunflower, corn, soybean, cottonseed, sesame, canola, olive, or peanut. Non-hydrogenated margarines. Salad dressing or mayonnaise: homemade or commercial, made with a recommended oil. Low or nonfat salad dressing or mayonnaise.  Limit added fats and oils to 6 to 8 tsp per day (includes fats used in cooking, baking, salads, and spreads on bread). Remember to count the "hidden fats" in foods.  Avoid: Solid fats and shortenings: butter, lard, salt pork, bacon drippings. Gravy containing meat fat, shortening, or suet. Cocoa butter, coconut. Coconut oil, palm oil, palm kernel oil, or hydrogenated oils: these ingredients are often used in bakery products, nondairy creamers, whipped toppings, candy, and commercially fried foods. Read labels carefully. Salad dressings made of unknown oils, sour cream, or cheese, such as blue cheese and Roquefort. Cream, all kinds: half-and-half, light, heavy, or whipping. Sour cream or cream cheese (even if "light" or low-fat). Nondairy cream substitutes: coffee creamers and sour cream substitutes made with palm, palm kernel, hydrogenated oils, or coconut oil. Beverages  Allowed: Coffee (regular or decaffeinated), tea. Diet carbonated beverages, mineral water. Alcohol: Check with your caregiver. Moderation is recommended.  Avoid: Whole milk, regular sodas, and juice drinks with added sugar. Condiments  Allowed: All seasonings and  condiments. Cocoa powder. "Cream" sauces made with recommended ingredients.  Avoid: Carob powder made with hydrogenated fats. SAMPLE MENU Breakfast   cup orange juice   cup oatmeal  1 slice toast  1 tsp margarine  1 cup skim milk Lunch  [place] sandwich with 2 oz [place], 2 slices bread  Lettuce and tomato slices  Fresh fruit  Carrot sticks  Coffee or tea Snack  Fresh fruit or low-fat crackers Dinner  3 oz lean ground beef  1 baked potato  1 tsp margarine   cup asparagus  Lettuce salad  1 tbs non-creamy dressing   cup peach slices  1 cup skim milk Document Released: 11/24/2007 Document Revised: 08/16/2011 Document Reviewed: 05/10/2011 Exit[employer] Patient Information 2014 [place], [place].

## 2012-12-07 ENCOUNTER — Ambulatory Visit (INDEPENDENT_AMBULATORY_CARE_PROVIDER_SITE_OTHER): Payer: 59 | Admitting: Psychiatry

## 2012-12-07 ENCOUNTER — Encounter (INDEPENDENT_AMBULATORY_CARE_PROVIDER_SITE_OTHER): Payer: Self-pay

## 2012-12-07 ENCOUNTER — Encounter (HOSPITAL_COMMUNITY): Payer: Self-pay | Admitting: Psychiatry

## 2012-12-07 DIAGNOSIS — F3130 Bipolar disorder, current episode depressed, mild or moderate severity, unspecified: Secondary | ICD-10-CM

## 2012-12-07 MED ORDER — BUPROPION HCL ER (XL) 150 MG PO TB24: 150.0000 mg | ORAL_TABLET | Freq: Every day | ORAL | Status: DC

## 2012-12-07 MED ORDER — ZOLPIDEM TARTRATE 10 MG PO TABS: 5.0000 mg | ORAL_TABLET | Freq: Every evening | ORAL | Status: DC | PRN

## 2012-12-07 NOTE — Progress Notes (Signed)
Psychiatric Assessment Adult  Patient Identification:  Brittney Lang Date of Evaluation:  12/07/2012 Chief Complaint: Need a new provider and feeling kind of depressed History of Chief Complaint:  No chief complaint on file.  this patient is a 67 year old African American widow, mother who is on disability for bipolar disorder. The urine half ago she ended care and stopped all her medications because she did not have transportation to see her provider. She's been off all medications and done fairly well up until the last few months. Just a few months ago she broke up with her partner. Her partner a woman had a long distance relationship with her from [place]. He was each other now and then in talking with him all the time. Apparently they got into some arguments and one to 2 months ago her partner ended the relationship. Since then the patient has been experiencing persistent daily depression. He was also approximately a month before that the patient's knee pain started to get worse. The pain in this breakup likely were precipitants that led to her having increased depression. She is sleeping worse this is likely related to her knee pain. She is eating well has good energy and has no problems concentrating. She denies worthlessness. She denies any psychomotor changes. The patient denies being suspicious. She denies being suicidal now or ever. The patient is still able to enjoy television and music going to church and reading. A close evaluation the patient fails to describe anything that sounds like mania. She denies euphoria or elation or intense irritability. She has had significant depressive episodes the last one was 2 years ago was associated with daily depression and problems with sleep appetite and energy. At that time her provider she was seeing adjusted her medications probably adding Zoloft to her Wellbutrin. At this time the patient denies any psychotic symptoms. Yet 8 years ago she did have  auditory hallucinations. The patient denies symptoms of generalized anxiety disorder, panic disorder or OCD. The patient has been a widow for 4 years but it's noted that she's been out of her relationship with her husband for years longer. She had 2 sons one of whom died of AIDS in 71. Her other son lives in [place] is married has 5 children but is in withdrawn from her. At this time other than the pain in her knees she denies any neurological symptoms at all. She denies shortness of breath or chest pain. She is medically fairly stable. She sees Dr. Harvest Dark at [facility] in [place]. It is noted the patient has no psychiatric history involving hospitalizations. She was seeing a psychiatrist in [place] (Dr. Neoma Laming) who was involved in a diagnosis of her bipolar disorder. 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  HPI Review of Systems Physical Exam  Depressive Symptoms: depressed mood,  (Hypo) Manic Symptoms:   Elevated Mood:  No Irritable Mood:  No Grandiosity:  No Distractibility:  No Labiality of Mood:  No Delusions:  No Hallucinations:  No Impulsivity:  No Sexually Inappropriate Behavior:  No Financial Extravagance:   Flight of Ideas:  No  Anxiety Symptoms: Excessive Worry:  No Panic Symptoms:  No Agoraphobia:  No Obsessive Compulsive: No  Symptoms: None, Specific Phobias:  No Social Anxiety:  No  Psychotic Symptoms:  Hallucinations: No None Delusions:  No Paranoia:  No   Ideas of Reference:  No  PTSD Symptoms: Ever had a traumatic exposure:  No Had a traumatic exposure in the last  month:  No Re-experiencing: No None Hypervigilance:  No Hyperarousal: No None Avoidance: No None  Traumatic Brain Injury: No   Past Psychiatric History: Diagnosis: Bipolar disorder   Hospitalizations:   Outpatient Care:   Substance Abuse Care:   Self-Mutilation:   Suicidal Attempts:   Violent Behaviors:    Past Medical History:    Past Medical History  Diagnosis Date  . Hypertension   . Hyperlipemia   . Generalized headaches   . Arthritis   . Depression    History of Loss of Consciousness:  No Seizure History:  No Cardiac History:  No Allergies:   Allergies  Allergen Reactions  . Sulfur     Unable to recall type pf reaction   . Nickel    Current Medications:  Current Outpatient Prescriptions  Medication Sig Dispense Refill  . buPROPion (WELLBUTRIN XL) 150 MG 24 hr tablet Take 1 tablet (150 mg total) by mouth daily. 1 qam for 1 week then 2 qam  60 tablet  5  . clindamycin (CLEOCIN) 300 MG capsule Take 1 capsule (300 mg total) by mouth 4 (four) times daily.  40 capsule  0  . pravastatin (PRAVACHOL) 40 MG tablet Take 1/2 tablet (20 mg) for 2 weeks then increase in 1 tablet daily at night  30 tablet  3  . valsartan (DIOVAN) 160 MG tablet Take 1 tablet (160 mg total) by mouth daily.  30 tablet  3  . zolpidem (AMBIEN) 10 MG tablet Take 0.5 tablets (5 mg total) by mouth at bedtime as needed for sleep.  30 tablet  5   No current facility-administered medications for this visit.    Previous Psychotropic Medications:  Medication Dose                          Substance Abuse History in the last 12 months: Substance Age of 1st Use Last Use Amount Specific Type    Medical Consequences of Substance Abuse:   Legal Consequences of Substance Abuse:   Family Consequences of Substance Abuse:   Blackouts:  No DT's:  No Withdrawal Symptoms:  No   Social History: Current Place of Residence: [place] of Birth:  Family Members:  Marital Status:  Widowed Children: 1  Sons:  Daughters:  Relationships:  Education:  [employer] Problems/Performance:  Religious Beliefs/Practices:  History of Abuse:  Teacher, music History:   Legal History:  Hobbies/Interests:   Family History:   Family History  Problem Relation Age of Onset  . Cancer Mother   . Heart  disease Mother   . Hypertension Mother   . Hyperlipidemia Mother   . Diabetes Other     Grand-daughter  . Depression Sister   . Depression Sister     Mental Status Examination/Evaluation: Objective:  Appearance: Casual  Eye Contact::  Good  Speech:  Normal Rate  Volume:  Normal  Mood:  depressed  Affect:  Congruent  Thought Process:  Coherent  Orientation:  Full (Time, Place, and Person)  Thought Content:  WDL  Suicidal Thoughts:  No  Homicidal Thoughts:  No  Judgement:  Good  Insight:  Good and Fair  Psychomotor Activity:  Normal  Akathisia:  No  Handed:  Right  AIMS (if indicated):     Assets:  Desire for Improvement    Laboratory/X-Ray Psychological Evaluation(s)        Assessment:  Axis I: Bipolar, Depressed  AXIS I Bipolar, Depressed  AXIS II Deferred  AXIS III Past Medical History  Diagnosis Date  . Hypertension   . Hyperlipemia   . Generalized headaches   . Arthritis   . Depression      AXIS IV problems related to social environment  AXIS V 61-70 mild symptoms   Treatment Plan/Recommendations:  Plan of Care: At this time we will start this patient back on and me in 5 mg at night. We shall start her on Wellbutrin take 150 mg one in the morning for a week and then increase to 2 thereafter. At this time I see no evidence of bipolar disorder. The issue is of course that she carries this is a diagnosis for her disability. Given that she is a number cardiovascular risk factors I will be slow to give her Abilify but if necessary that'll be my next intervention. We will hold off the Zoloft for now. The most important intervention will be for her to begin in talking therapy with Mrs. Boneta Lucks. This patient return to see me in 7 weeks. At this time I do not think the patient is dangerous or self or others. We reviewed the pros and cons of her medications and how to take them and she agreed to take them.   Laboratory:    Psychotherapy:   Medications:    Routine PRN Medications:    Consultations:   Safety Concerns:    Other:      Christine Schiefelbein, [place], MD 10/10/20149:11 AM

## 2012-12-20 ENCOUNTER — Ambulatory Visit (HOSPITAL_COMMUNITY): Payer: Self-pay | Admitting: Psychiatry

## 2012-12-28 ENCOUNTER — Ambulatory Visit (INDEPENDENT_AMBULATORY_CARE_PROVIDER_SITE_OTHER): Payer: 59 | Admitting: Internal Medicine

## 2012-12-28 ENCOUNTER — Encounter: Payer: Self-pay | Admitting: Internal Medicine

## 2012-12-28 VITALS — BP 140/78 | HR 89 | Temp 98.3°F | Resp 16 | Ht 62.75 in | Wt 177.0 lb

## 2012-12-28 DIAGNOSIS — R739 Hyperglycemia, unspecified: Secondary | ICD-10-CM

## 2012-12-28 DIAGNOSIS — R7309 Other abnormal glucose: Secondary | ICD-10-CM

## 2012-12-28 DIAGNOSIS — Z0000 Encounter for general adult medical examination without abnormal findings: Secondary | ICD-10-CM

## 2012-12-28 DIAGNOSIS — F489 Nonpsychotic mental disorder, unspecified: Secondary | ICD-10-CM

## 2012-12-28 DIAGNOSIS — E2839 Other primary ovarian failure: Secondary | ICD-10-CM

## 2012-12-28 DIAGNOSIS — Z23 Encounter for immunization: Secondary | ICD-10-CM

## 2012-12-28 DIAGNOSIS — N3941 Urge incontinence: Secondary | ICD-10-CM

## 2012-12-28 DIAGNOSIS — F5105 Insomnia due to other mental disorder: Secondary | ICD-10-CM | POA: Insufficient documentation

## 2012-12-28 DIAGNOSIS — E785 Hyperlipidemia, unspecified: Secondary | ICD-10-CM

## 2012-12-28 DIAGNOSIS — Z780 Asymptomatic menopausal state: Secondary | ICD-10-CM

## 2012-12-28 DIAGNOSIS — M170 Bilateral primary osteoarthritis of knee: Secondary | ICD-10-CM

## 2012-12-28 DIAGNOSIS — I10 Essential (primary) hypertension: Secondary | ICD-10-CM

## 2012-12-28 DIAGNOSIS — D249 Benign neoplasm of unspecified breast: Secondary | ICD-10-CM

## 2012-12-28 DIAGNOSIS — M1710 Unilateral primary osteoarthritis, unspecified knee: Secondary | ICD-10-CM

## 2012-12-28 DIAGNOSIS — Z1231 Encounter for screening mammogram for malignant neoplasm of breast: Secondary | ICD-10-CM

## 2012-12-28 NOTE — Patient Instructions (Signed)
Please go get your knee xrays done at your convenience.  I recommend your pneumonia shot next visit in 6 wks  Staff will call you with the appointment for your mammogram and bone density test  Try the melatonin for your sleeping problem--take 1-2 hrs before bed

## 2012-12-28 NOTE — Progress Notes (Signed)
Patient ID: Brittney Lang, female   DOB: 1946/02/02, 67 y.o.   MRN: [id number] Location:  [facility]  Code Status: needs to be discussed; full code   Allergies  Allergen Reactions  . Sulfur     Unable to recall type pf reaction   . Nickel     Chief Complaint  Patient presents with  . Annual Exam    physical with no labs, she's fasting  . Knee Pain    bilateral knee swelling/pain x 3-4 months  . other    trouble sleeping    HPI: Patient is a 67 y.o. female seen in the office today for her annual physical.  She has previously established care with Candelaria Celeste, NP, and has been seen twice.  Prior to that she had not been seen by a PCP for over a year and was out of medications.  She has recently had difficulty with a right dental abscess for which she has completed abx.    Her PMH includes bilateral osteoarthritis, htn, hl, headaches, insomnia, urge incontinence, cataracts, contact dermatitis, bipolar disorder, memory loss, anxiety, prior right ankle and arm fx 2 years ago, hysterectomy, appendectomy.  She has recently been started on valsartan for her hypertension and pravachol for his hyperlipidemia.  She was just seen by behavioral health for her bipolar disorder.    Upon review of her labs, her glucose levels have been elevated.    Tooth abcess has gotten better.  Wants to get back on medicaid.    Has been having knee problems with swelling for 3-4 mos.  Swelling is medial.  Uses brace that she switches back and forth.  Sometimes pain so bad, she cannot touch her knees in bed.  Walks her dog, walks to store.  Uses cane b/c knee sometimes gives out.  Uses aleve sometimes.  Also uses muscle rub sometimes.  Has never had any imaging of her knees.  Mood is about the same.  Has continued on wellbutrin.  Used to go to [place] [facility] who had her on valium, wellbutrin and abilify.  Dr. Donell Beers started [place], but it is not helping.   Sleeps  for a couple hrs, then wakes up every couple of hours on the clock for the rest of the night.  Very seldom naps in the days.  Was not able to get melatonin before.   Does have to urinate some of the time.  Goes 3-4 times.  Does have to rush to the bathroom.  Has some leakage, as well.  Not bad enough to use pads.    Did quit smoking 5-6 years ago for a couple of weeks.  Had gradually cut back and eventually stopped.    Review of Systems:  Review of Systems  Constitutional: Negative for fever and chills.  HENT: Positive for hearing loss. Negative for congestion.        Cerumen impaction;  Poor dentition  Eyes: Positive for blurred vision.  Respiratory: Negative for shortness of breath.   Cardiovascular: Negative for chest pain and palpitations.  Gastrointestinal: Negative for heartburn, abdominal pain, blood in stool and melena.  Genitourinary: Positive for urgency. Negative for dysuria and frequency.  Musculoskeletal: Positive for falls and joint pain. Negative for myalgias.  Skin: Negative for rash.  Neurological: Negative for dizziness, focal weakness and headaches.  Endo/Heme/Allergies: Does not bruise/bleed easily.       Polyuria, high fasting glucose  Psychiatric/Behavioral: Positive for depression and memory loss.  Dx'd with bipolar    Past Medical History  Diagnosis Date  . Hypertension   . Hyperlipemia   . Generalized headaches   . Arthritis   . Depression     Past Surgical History  Procedure Laterality Date  . Abdominal hysterectomy  1980  . Ankle fracture surgery  2011    Left   . Appendectomy  1980    Social History:   reports that she has been smoking Cigarettes.  She has a 15 pack-year smoking history. She does not have any smokeless tobacco history on file. She reports that she drinks alcohol. She reports that she does not use illicit drugs.  Family History  Problem Relation Age of Onset  . Cancer Mother   . Heart disease Mother   . Hypertension  Mother   . Hyperlipidemia Mother   . Diabetes Other     Grand-daughter  . Depression Sister   . Depression Sister     Medications: Patient's Medications  New Prescriptions   No medications on file  Previous Medications   BUPROPION (WELLBUTRIN XL) 150 MG 24 HR TABLET    Take 1 tablet (150 mg total) by mouth daily. 1 qam for 1 week then 2 qam   PRAVASTATIN (PRAVACHOL) 40 MG TABLET    Take 1/2 tablet (20 mg) for 2 weeks then increase in 1 tablet daily at night   VALSARTAN (DIOVAN) 160 MG TABLET    Take 1 tablet (160 mg total) by mouth daily.   ZOLPIDEM (AMBIEN) 10 MG TABLET    Take 0.5 tablets (5 mg total) by mouth at bedtime as needed for sleep.  Modified Medications   No medications on file  Discontinued Medications   CLINDAMYCIN (CLEOCIN) 300 MG CAPSULE    Take 1 capsule (300 mg total) by mouth 4 (four) times daily.   Physical Exam: Filed Vitals:   12/28/12 0825  BP: 140/78  Pulse: 89  Temp: 98.3 F (36.8 C)  TempSrc: Oral  Resp: 16  Height: 5' 2.75" (1.594 m)  Weight: 177 lb (80.287 kg)  SpO2: 98%  Physical Exam  Constitutional: She is oriented to person, place, and time. She appears well-developed and well-nourished. No distress.  Black female, nad  HENT:  Head: Normocephalic and atraumatic.  Right Ear: External ear normal.  Left Ear: External ear normal.  Nose: Nose normal.  Mouth/Throat: Oropharynx is clear and moist. No oropharyngeal exudate.  Very poor dentition, multiple missing teeth and parts of teeth remaining in disrepair  Eyes: Conjunctivae and EOM are normal. Pupils are equal, round, and reactive to light. No scleral icterus.  cataracts  Neck: Normal range of motion. No JVD present. No tracheal deviation present. No thyromegaly present.  Cardiovascular: Normal rate, regular rhythm, normal heart sounds and intact distal pulses.   No murmur heard. Pulmonary/Chest: Effort normal and breath sounds normal. No respiratory distress. Right breast exhibits no  mass, no nipple discharge, no skin change and no tenderness. Left breast exhibits no mass, no nipple discharge, no skin change and no tenderness. Breasts are symmetrical.  Abdominal: Soft. Bowel sounds are normal. She exhibits no distension and no mass. There is no tenderness.  Refused rectal exam and hemoccult;  Has horizontal cicatrix at level of umbilicus--no umbilicus due to prior hernia surgery as a baby  Genitourinary:  Prior hysterectomy, no longer requires pap due to age  Musculoskeletal: Normal range of motion. She exhibits edema. She exhibits no tenderness.  Mild swelling of bilateral knees, some crepitus present  Lymphadenopathy:    She has no cervical adenopathy.  Neurological: She is alert and oriented to person, place, and time. She has normal reflexes. No cranial nerve deficit. She exhibits normal muscle tone. Coordination normal.  Skin: Skin is warm and dry.  Psychiatric:  Slightly flat affect     Labs reviewed: Basic Metabolic Panel:  Recent Labs  16/10/96 1701 11/15/12 1011  NA 142 142  K 3.9 4.0  CL 106 99  CO2  --  25  GLUCOSE 152* 101*  BUN 9 10  CREATININE 0.90 0.71  CALCIUM  --  10.0   Liver Function Tests:  Recent Labs  11/15/12 1011  AST 15  ALT 15  ALKPHOS 106  BILITOT 0.3  PROT 6.6  CBC:  Recent Labs  10/06/12 1701 11/15/12 1011  WBC  --  7.0  NEUTROABS  --  4.6  HGB 15.6* 15.0  HCT 46.0 43.6  MCV  --  87  PLT  --  329   Lipid Panel:  Recent Labs  11/15/12 1011  HDL 33*  LDLCALC 160*  TRIG 191*  CHOLHDL 7.0*   Assessment/Plan 1. Need for prophylactic vaccination and inoculation against influenza -flu shot given today  2. Fibroadenoma, unspecified laterality -exam was benign -has h/o this but she does not recall which breast -has tag in one breast but she does not know which -says no malignancy has ever been found, but has had several biopsies and ultrasounds in [place] - MM Digital Diagnostic Bilat; Future  3. Routine  general medical examination at a health care facility -mammogram - DG Bone Density; Future--not on ca with D, is smoker  4. Hyperlipidemia LDL goal < 100 -f/u lipids b/c she has now been on pravachol for 6 wks--probably needs higher dose - Lipid panel  5. Hyperglycemia -noted on previous labs (fasting)--assess for diabetes--has some nocturia/polyuria - Hemoglobin A1c - Basic metabolic panel  6. Essential hypertension, benign -BP borderline today with valsartan--may need future adjustment if remains over 130 next time (she is relatively young and active and could tolerate lower bp especially as a smoker with hyperlipidemia) - Basic metabolic panel  7. Insomnia due to mental disorder -encouraged her to try melatonin 1-2 hours before bed as ambien not helping  8. Urge incontinence -mild at this point--not requiring pads, is just occasional  9. Primary osteoarthritis of both knees -suspect this is cause of her knee pain, continue brace, occasional aleve, cane for stability - DG Knee Complete 4 Views Left; Future - DG Knee Complete 4 Views Right; Future  Labs/tests ordered:  FLP, hba1c, bilateral knee xrays, MM and bone density Next appt:  6 wks with Jessica--pneumonia shot due then, also needs to get take-home hemoccult (refused DRE) and discussion of living will/hcpoa

## 2012-12-29 LAB — BASIC METABOLIC PANEL
BUN/Creatinine Ratio: 11 (ref 11–26)
BUN: 8 mg/dL (ref 8–27)
CO2: 19 mmol/L (ref 18–29)
Calcium: 9.8 mg/dL (ref 8.6–10.2)
Chloride: 103 mmol/L (ref 97–108)
Creatinine, Ser: 0.74 mg/dL (ref 0.57–1.00)
GFR calc Af Amer: 98 mL/min/{1.73_m2} (ref >59)
GFR calc non Af Amer: 85 mL/min/{1.73_m2} (ref >59)
Glucose: 75 mg/dL (ref 65–99)
Potassium: 4 mmol/L (ref 3.5–5.2)
Sodium: 143 mmol/L (ref 134–144)

## 2012-12-29 LAB — LIPID PANEL
Chol/HDL Ratio: 4.8 ratio units — ABNORMAL HIGH (ref 0.0–4.4)
Cholesterol, Total: 216 mg/dL — ABNORMAL HIGH (ref 100–199)
HDL: 45 mg/dL (ref >39)
LDL Calculated: 137 mg/dL — ABNORMAL HIGH (ref 0–99)
Triglycerides: 172 mg/dL — ABNORMAL HIGH (ref 0–149)
VLDL Cholesterol Cal: 34 mg/dL (ref 5–40)

## 2012-12-29 LAB — HEMOGLOBIN A1C
Est. average glucose Bld gHb Est-mCnc: 131 mg/dL
Hgb A1c MFr Bld: 6.2 % — ABNORMAL HIGH (ref 4.8–5.6)

## 2013-01-02 ENCOUNTER — Telehealth (HOSPITAL_COMMUNITY): Payer: Self-pay | Admitting: *Deleted

## 2013-01-02 NOTE — Telephone Encounter (Signed)
Actually I would like her to keep her next appointment and continue taking the antidepressant as prescribed. I would like her to be seen in psychotherapy. I would like to give the Wellbutrin more of a chance and I like to make changes in her next scheduled appointment which should be in approximately 2 weeks. If she's not sleeping she may increase her sleeping aid by doubling it. At this time I'll not at any other mood altering medications. Tell her to be sure to keep her next appointment in her therapy appointments.

## 2013-01-02 NOTE — Telephone Encounter (Signed)
Pt left EA:[id number] generic Wellbutrin XL, 2 in the morning.Not working by itself for her.Has taken in past.Needs some Valium or Abilify or something added to it to help her. Please call. Pharmacy is [facility]rapist[facility]

## 2013-01-03 ENCOUNTER — Ambulatory Visit (HOSPITAL_COMMUNITY): Payer: Self-pay | Admitting: Psychiatry

## 2013-01-04 ENCOUNTER — Telehealth (HOSPITAL_COMMUNITY): Payer: Self-pay

## 2013-01-07 ENCOUNTER — Telehealth: Payer: Self-pay | Admitting: *Deleted

## 2013-01-07 ENCOUNTER — Other Ambulatory Visit: Payer: Self-pay | Admitting: Family Medicine

## 2013-01-07 NOTE — Addendum Note (Signed)
Addended by: Waymond Cera on: 01/07/2013 03:07 PM   Modules accepted: Orders

## 2013-01-07 NOTE — Telephone Encounter (Signed)
Bloodwork results given to patient from 12/28/2012

## 2013-01-08 NOTE — Telephone Encounter (Signed)
Per Dr. Archer Asa, MD at 01/02/2013  4:13 PM    Status: Signed          " Actually I would like her to keep her next appointment and continue taking the antidepressant as prescribed. I would like her to be seen in psychotherapy. I would like to give the Wellbutrin more of a chance and I like to make changes in her next scheduled appointment which should be in approximately 2 weeks. If she's not sleeping she may increase her sleeping aid by doubling it. At this time I'll not at any other mood altering medications. Tell her to be sure to keep her next appointment in her therapy appointments. "  Called patient and informed her of Dr.Plovsky's directions. Pt states her appt is in December and she needs to talk with him before that.Advised to contact front desk in AM to schedule earlier appt. Advised pt that provider in office Thursday 11/13, and may be able to see her then.

## 2013-01-10 ENCOUNTER — Telehealth (HOSPITAL_COMMUNITY): Payer: Self-pay | Admitting: Psychiatry

## 2013-01-10 MED ORDER — ARIPIPRAZOLE 5 MG PO TABS: 5.0000 mg | ORAL_TABLET | Freq: Every day | ORAL | Status: DC

## 2013-01-10 NOTE — Telephone Encounter (Signed)
Today spoke with the patient and she shared with me that the dose well which in that she's on now has not worked in the past she will continue it but will go ahead and add Abilify 5 mg to it. She has a return appointment to see me in about 2-3 weeks and at that time if she's not better will go ahead and increase her Wellbutrin to the highest dose of 450. At this time I will go ahead and call in Abilify 5 mg every morning.

## 2013-01-17 ENCOUNTER — Ambulatory Visit (HOSPITAL_COMMUNITY): Payer: Self-pay | Admitting: Psychiatry

## 2013-02-01 ENCOUNTER — Ambulatory Visit (HOSPITAL_COMMUNITY): Payer: Self-pay | Admitting: Psychiatry

## 2013-02-08 ENCOUNTER — Ambulatory Visit
Admission: RE | Admit: 2013-02-08 | Discharge: 2013-02-08 | Disposition: A | Discharge status: Home / Self Care | Payer: PRIVATE HEALTH INSURANCE | Source: Ambulatory Visit | Attending: Internal Medicine | Admitting: Internal Medicine

## 2013-02-08 DIAGNOSIS — Z780 Asymptomatic menopausal state: Secondary | ICD-10-CM

## 2013-02-08 DIAGNOSIS — E2839 Other primary ovarian failure: Secondary | ICD-10-CM

## 2013-02-08 DIAGNOSIS — Z1231 Encounter for screening mammogram for malignant neoplasm of breast: Secondary | ICD-10-CM

## 2013-02-11 ENCOUNTER — Encounter: Payer: Self-pay | Admitting: *Deleted

## 2013-02-14 ENCOUNTER — Ambulatory Visit (INDEPENDENT_AMBULATORY_CARE_PROVIDER_SITE_OTHER): Payer: 59 | Admitting: Internal Medicine

## 2013-02-14 ENCOUNTER — Encounter: Payer: Self-pay | Admitting: Internal Medicine

## 2013-02-14 VITALS — BP 132/88 | HR 81 | Temp 98.6°F | Resp 10 | Wt 176.0 lb

## 2013-02-14 DIAGNOSIS — D249 Benign neoplasm of unspecified breast: Secondary | ICD-10-CM

## 2013-02-14 DIAGNOSIS — F329 Major depressive disorder, single episode, unspecified: Secondary | ICD-10-CM

## 2013-02-14 DIAGNOSIS — F32A Depression, unspecified: Secondary | ICD-10-CM

## 2013-02-14 DIAGNOSIS — M174 Other bilateral secondary osteoarthritis of knee: Secondary | ICD-10-CM

## 2013-02-14 DIAGNOSIS — M810 Age-related osteoporosis without current pathological fracture: Secondary | ICD-10-CM

## 2013-02-14 DIAGNOSIS — R739 Hyperglycemia, unspecified: Secondary | ICD-10-CM

## 2013-02-14 DIAGNOSIS — M175 Other unilateral secondary osteoarthritis of knee: Secondary | ICD-10-CM

## 2013-02-14 DIAGNOSIS — R7309 Other abnormal glucose: Secondary | ICD-10-CM

## 2013-02-14 DIAGNOSIS — F3289 Other specified depressive episodes: Secondary | ICD-10-CM

## 2013-02-14 DIAGNOSIS — E785 Hyperlipidemia, unspecified: Secondary | ICD-10-CM

## 2013-02-14 DIAGNOSIS — G4700 Insomnia, unspecified: Secondary | ICD-10-CM

## 2013-02-14 MED ORDER — VITAMIN D3 50 MCG (2000 UT) PO CAPS: 2000.0000 [IU] | ORAL_CAPSULE | Freq: Every day | ORAL | Status: DC

## 2013-02-14 MED ORDER — PRAVASTATIN SODIUM 80 MG PO TABS: 80.0000 mg | ORAL_TABLET | Freq: Every day | ORAL | Status: DC

## 2013-02-14 NOTE — Progress Notes (Signed)
Patient ID: Brittney Lang, female   DOB: December 01, 1945, 67 y.o.   MRN: [id number]   Location:  [facility]  Allergies  Allergen Reactions  . Sulfur     Unable to recall type pf reaction   . Nickel     Chief Complaint  Patient presents with  . Medical Managment of Chronic Issues    6 week follow-up (discuss labs, mammogram and BMD)     HPI: Patient is a 67 y.o. black female seen in the office today for review of labs, mammogram, bone density Left breast still with mass palpable, but no tenderness.  Mammogram was unremarkable.   Is smoking less.   Bone density does show osteoporosis.   Only started to gain weight in past couple of years.  Discussed walking.   Will wake up with throbbing of knees like burning at night.   Mood ok, but not too good.  Is supposed to see Dr. Donell Beers, but did not have transportation.  Taking abilify with wellbutrin.  Used to also take valium.    Review of Systems:  Review of Systems  Constitutional: Negative for fever and chills.  HENT: Negative for congestion.   Respiratory: Positive for cough. Negative for shortness of breath.   Cardiovascular: Negative for chest pain.  Gastrointestinal: Negative for abdominal pain.  Genitourinary: Negative for dysuria.  Musculoskeletal: Positive for joint pain.  Skin: Negative for rash.  Neurological: Negative for dizziness.  Psychiatric/Behavioral: Positive for depression.     Past Medical History  Diagnosis Date  . Hypertension   . Hyperlipemia   . Generalized headaches   . Arthritis   . Depression     Past Surgical History  Procedure Laterality Date  . Abdominal hysterectomy  1980  . Ankle fracture surgery  2011    Left   . Appendectomy  1980    Social History:   reports that she has been smoking Cigarettes.  She has a 15 pack-year smoking history. She does not have any smokeless tobacco history on file. She reports that she drinks alcohol. She  reports that she does not use illicit drugs.  Family History  Problem Relation Age of Onset  . Cancer Mother   . Heart disease Mother   . Hypertension Mother   . Hyperlipidemia Mother   . Diabetes Other     Grand-daughter  . Depression Sister   . Depression Sister     Medications: Patient's Medications  New Prescriptions   No medications on file  Previous Medications   ARIPIPRAZOLE (ABILIFY) 5 MG TABLET    Take 1 tablet (5 mg total) by mouth daily.   BUPROPION (WELLBUTRIN XL) 150 MG 24 HR TABLET    Take 1 tablet (150 mg total) by mouth daily. 1 qam for 1 week then 2 qam   PRAVASTATIN (PRAVACHOL) 40 MG TABLET    Take 1/2 tablet (20 mg) for 2 weeks then increase in 1 tablet daily at night   VALSARTAN (DIOVAN) 160 MG TABLET    Take 1 tablet (160 mg total) by mouth daily.   ZOLPIDEM (AMBIEN) 10 MG TABLET    Take 0.5 tablets (5 mg total) by mouth at bedtime as needed for sleep.  Modified Medications   No medications on file  Discontinued Medications   No medications on file     Physical Exam: Filed Vitals:   02/14/13 0945  BP: 132/88  Pulse: 81  Temp: 98.6 F (37 C)  TempSrc: Oral  Resp: 10  Weight: 176 lb (79.833 kg)  SpO2: 97%  Physical Exam  Constitutional: She is oriented to person, place, and time. She appears well-developed and well-nourished. No distress.  HENT:  Head: Normocephalic and atraumatic.  Eyes: EOM are normal. Pupils are equal, round, and reactive to light.  Eyes red  Neck: Neck supple.  Cardiovascular: Normal rate, regular rhythm, normal heart sounds and intact distal pulses.   Pulmonary/Chest: Effort normal and breath sounds normal. No respiratory distress.  Abdominal: Soft. Bowel sounds are normal. She exhibits no distension. There is no tenderness.  Musculoskeletal: Normal range of motion. She exhibits no edema and no tenderness.  Neurological: She is alert and oriented to person, place, and time.  Skin: Skin is warm and dry.  Psychiatric:  A  bit irritated at first b/c I was running late, but later quite pleasant    Labs reviewed: Basic Metabolic Panel:  Recent Labs  28/41/32 1701 11/15/12 1011 12/28/12 0943  NA 142 142 143  K 3.9 4.0 4.0  CL 106 99 103  CO2  --  25 19  GLUCOSE 152* 101* 75  BUN 9 10 8   CREATININE 0.90 0.71 0.74  CALCIUM  --  10.0 9.8   Liver Function Tests:  Recent Labs  11/15/12 1011  AST 15  ALT 15  ALKPHOS 106  BILITOT 0.3  PROT 6.6  CBC:  Recent Labs  10/06/12 1701 11/15/12 1011  WBC  --  7.0  NEUTROABS  --  4.6  HGB 15.6* 15.0  HCT 46.0 43.6  MCV  --  87  PLT  --  329   Lipid Panel:  Recent Labs  11/15/12 1011 12/28/12 0943  HDL 33* 45  LDLCALC 160* 137*  TRIG 191* 172*  CHOLHDL 7.0* 4.8*   Lab Results  Component Value Date   HGBA1C 6.2* 12/28/2012   02/08/13 Bone density:  Senile osteoporosis 02/08/13 Mammogram:  Fibroglandular densities present, but unremarkable for malignancy  Assessment/Plan 1. Hyperlipidemia LDL goal < 100 - increase pravastatin (PRAVACHOL) 80 MG tablet; Take 1 tablet (80 mg total) by mouth daily.  Dispense: 30 tablet; Refill: 3  2. Hyperglycemia - encouraged diet and exercis  3. Fibroadenoma, unspecified laterality -left breast still with notable mass area, but none noted on mammogram -will plan to do ultrasound in June and diagnostic mammogram due to this  4. Senile osteoporosis -encouraged weightbearing exercise - Cholecalciferol (VITAMIN D3) 2000 UNITS capsule; Take 1 capsule (2,000 Units total) by mouth daily.  Dispense: 30 capsule; Refill: 3  5. Depression -f/u with Dr. Bari Mantis feels wellbutrin is not helping, is also on abilify -asked for valium, but I don't think this is a good idea for her due to potential side effects b/c of her increasing age  31. Insomnia -was put on ambien by Dr. Donell Beers, but has not responded to this -missed f/u and encouraged to reschedule  7.  Bilateral knee osteoarthritis -did not get knee  xrays b/c didn't know where to go so will now get them at [facility]  Next appt:  6 wks

## 2013-04-04 ENCOUNTER — Ambulatory Visit: Payer: 59 | Admitting: Internal Medicine

## 2013-04-29 ENCOUNTER — Other Ambulatory Visit (HOSPITAL_COMMUNITY): Payer: Self-pay | Admitting: Psychiatry

## 2013-05-01 NOTE — Telephone Encounter (Signed)
Per MD, needs to be seen before any refill given

## 2013-05-02 ENCOUNTER — Ambulatory Visit: Payer: 59 | Admitting: Internal Medicine

## 2013-05-24 ENCOUNTER — Encounter: Payer: Self-pay | Admitting: Internal Medicine

## 2013-05-24 ENCOUNTER — Ambulatory Visit (INDEPENDENT_AMBULATORY_CARE_PROVIDER_SITE_OTHER): Payer: 59 | Admitting: Internal Medicine

## 2013-05-24 VITALS — BP 132/90 | HR 78 | Temp 96.8°F | Resp 16 | Ht 62.75 in | Wt 179.4 lb

## 2013-05-24 DIAGNOSIS — M1710 Unilateral primary osteoarthritis, unspecified knee: Secondary | ICD-10-CM

## 2013-05-24 DIAGNOSIS — F3289 Other specified depressive episodes: Secondary | ICD-10-CM

## 2013-05-24 DIAGNOSIS — F32A Depression, unspecified: Secondary | ICD-10-CM

## 2013-05-24 DIAGNOSIS — M170 Bilateral primary osteoarthritis of knee: Secondary | ICD-10-CM

## 2013-05-24 DIAGNOSIS — R739 Hyperglycemia, unspecified: Secondary | ICD-10-CM

## 2013-05-24 DIAGNOSIS — F329 Major depressive disorder, single episode, unspecified: Secondary | ICD-10-CM

## 2013-05-24 DIAGNOSIS — R7309 Other abnormal glucose: Secondary | ICD-10-CM

## 2013-05-24 DIAGNOSIS — I10 Essential (primary) hypertension: Secondary | ICD-10-CM

## 2013-05-24 DIAGNOSIS — E785 Hyperlipidemia, unspecified: Secondary | ICD-10-CM

## 2013-05-24 DIAGNOSIS — G4700 Insomnia, unspecified: Secondary | ICD-10-CM

## 2013-05-24 MED ORDER — VALSARTAN 320 MG PO TABS: 320.0000 mg | ORAL_TABLET | Freq: Every day | ORAL | Status: DC

## 2013-05-24 MED ORDER — ZOLPIDEM TARTRATE 10 MG PO TABS: 5.0000 mg | ORAL_TABLET | Freq: Every evening | ORAL | Status: DC | PRN

## 2013-05-24 MED ORDER — BUPROPION HCL ER (XL) 300 MG PO TB24: 300.0000 mg | ORAL_TABLET | Freq: Every day | ORAL | Status: DC

## 2013-05-24 NOTE — Patient Instructions (Signed)
You may go get your knee xrays at either [place] imaging site.

## 2013-05-24 NOTE — Progress Notes (Signed)
Patient ID: Brittney Lang, female   DOB: Mar 06, 1945, 68 y.o.   MRN: [id number]   Location:  [facility]  Allergies  Allergen Reactions  . Sulfur     Unable to recall type pf reaction   . Nickel     Chief Complaint  Patient presents with  . Follow-up    6 week f/u & insruance form ([facility])  . other    BP has been arround 200/100 for last 2 days.  has taken 1 1/2 of Valsartan to get it to 130/80  . Immunizations    declines Colonoscopy, Shingles, and Pneumonia vaccines    HPI: Patient is a 68 y.o. female seen in the office today for 6 week f/u.    BP remaining high.  Best was 130s/80s after 1.5 valsartan tablets.  Headaches and dizziness with bp high.  Is taking low dose asa 81mg .    Mood is umm... It's there.  It is better than last visit.    Never got to [place].  Still having pain in medial left knee.    Review of Systems:  Review of Systems  Constitutional: Positive for malaise/fatigue. Negative for fever.  HENT: Negative for congestion.   Eyes: Negative for blurred vision.  Respiratory: Negative for shortness of breath.   Cardiovascular: Negative for chest pain.  Genitourinary: Negative for dysuria.  Musculoskeletal: Positive for joint pain.  Neurological: Positive for focal weakness and headaches. Negative for dizziness.       Left leg, as relates to left knee pain  Psychiatric/Behavioral: Positive for depression. Negative for memory loss.    Past Medical History  Diagnosis Date  . Hypertension   . Hyperlipemia   . Generalized headaches   . Arthritis   . Depression     Past Surgical History  Procedure Laterality Date  . Abdominal hysterectomy  1980  . Ankle fracture surgery  2011    Left   . Appendectomy  1980    Social History:   reports that she has been smoking Cigarettes.  She has a 15 pack-year smoking history. She does not have any smokeless tobacco history on file. She reports that she  drinks alcohol. She reports that she does not use illicit drugs.  Family History  Problem Relation Age of Onset  . Cancer Mother   . Heart disease Mother   . Hypertension Mother   . Hyperlipidemia Mother   . Diabetes Other     Grand-daughter  . Depression Sister   . Depression Sister     Medications: Patient's Medications  New Prescriptions   No medications on file  Previous Medications   ARIPIPRAZOLE (ABILIFY) 5 MG TABLET    Take 1 tablet (5 mg total) by mouth daily.   BUPROPION (WELLBUTRIN XL) 150 MG 24 HR TABLET    Take 1 tablet (150 mg total) by mouth daily. 1 qam for 1 week then 2 qam   CHOLECALCIFEROL (VITAMIN D3) 2000 UNITS CAPSULE    Take 1 capsule (2,000 Units total) by mouth daily.   PRAVASTATIN (PRAVACHOL) 80 MG TABLET    Take 1 tablet (80 mg total) by mouth daily.   VALSARTAN (DIOVAN) 160 MG TABLET    Take 1 tablet (160 mg total) by mouth daily.   ZOLPIDEM (AMBIEN) 10 MG TABLET    Take 0.5 tablets (5 mg total) by mouth at bedtime as needed for sleep.  Modified Medications   No medications on file  Discontinued Medications  No medications on file     Physical Exam: Filed Vitals:   05/24/13 0956  BP: 164/92  Pulse: 78  Temp: 96.8 F (36 C)  TempSrc: Oral  Resp: 16  Height: 5' 2.75" (1.594 m)  Weight: 179 lb 6.4 oz (81.375 kg)  SpO2: 94%  Physical Exam  Constitutional: She is oriented to person, place, and time.  Cardiovascular: Normal rate, regular rhythm, normal heart sounds and intact distal pulses.   Pulmonary/Chest: Effort normal and breath sounds normal. No respiratory distress.  Abdominal: Soft. Bowel sounds are normal. She exhibits no distension and no mass. There is no tenderness.  Musculoskeletal:  Tenderness left medial knee, limps when walks, no assistive device used  Neurological: She is alert and oriented to person, place, and time.  Skin: Skin is warm and dry.  Psychiatric:  Flat affect    Labs reviewed: Basic Metabolic  Panel:  Recent Labs  10/06/12 1701 11/15/12 1011 12/28/12 0943  NA 142 142 143  K 3.9 4.0 4.0  CL 106 99 103  CO2  --  25 19  GLUCOSE 152* 101* 75  BUN 9 10 8   CREATININE 0.90 0.71 0.74  CALCIUM  --  10.0 9.8   Liver Function Tests:  Recent Labs  11/15/12 1011  AST 15  ALT 15  ALKPHOS 106  BILITOT 0.3  PROT 6.6   No results found for this basename: LIPASE, AMYLASE,  in the last 8760 hours No results found for this basename: AMMONIA,  in the last 8760 hours CBC:  Recent Labs  10/06/12 1701 11/15/12 1011  WBC  --  7.0  NEUTROABS  --  4.6  HGB 15.6* 15.0  HCT 46.0 43.6  MCV  --  87  PLT  --  329   Lipid Panel:  Recent Labs  11/15/12 1011 12/28/12 0943  HDL 33* 45  LDLCALC 160* 137*  TRIG 191* 172*  CHOLHDL 7.0* 4.8*   Lab Results  Component Value Date   HGBA1C 6.2* 12/28/2012    Assessment/Plan 1. Essential hypertension, benign -cont bp regimen with valsartan, has a lot of pain and this is bringing up her bp, needs to get knee taken care of - return in a week for f/u bp check -CBC With differential/Platelet - Comprehensive metabolic panel  2. Hyperlipidemia LDL goal < 100 -cont pravachol - Comprehensive metabolic panel - Lipid panel  3. Hyperglycemia -I educated the patient on a low carb diet including decreased starches, fried foods, and sweets as well as regular exercise at least 3-5 times per week.   - Hemoglobin A1c  4. Depression -cont abilify and wellbutrin - buPROPion (WELLBUTRIN XL) 300 MG 24 hr tablet; Take 1 tablet (300 mg total) by mouth daily.  Dispense: 60 tablet; Refill: 5  5. Insomnia -continues on ambien--is aware of risks and chooses to accept them   6. Primary osteoarthritis of both knees -needs to get xrays I ordered  Labs/tests ordered: Orders Placed This Encounter  Procedures  . CBC With differential/Platelet  . Comprehensive metabolic panel  . Lipid panel  . Hemoglobin A1c    Next appt:  1 wk bp check with  [facility] and 6 wks with me

## 2013-05-25 LAB — COMPREHENSIVE METABOLIC PANEL
ALT: 17 IU/L (ref 0–32)
AST: 16 IU/L (ref 0–40)
Albumin/Globulin Ratio: 2 (ref 1.1–2.5)
Albumin: 4.5 g/dL (ref 3.6–4.8)
Alkaline Phosphatase: 86 IU/L (ref 39–117)
BUN/Creatinine Ratio: 22 (ref 11–26)
BUN: 17 mg/dL (ref 8–27)
CO2: 16 mmol/L — ABNORMAL LOW (ref 18–29)
Calcium: 9.9 mg/dL (ref 8.7–10.3)
Chloride: 104 mmol/L (ref 97–108)
Creatinine, Ser: 0.77 mg/dL (ref 0.57–1.00)
GFR calc Af Amer: 93 mL/min/{1.73_m2} (ref >59)
GFR calc non Af Amer: 81 mL/min/{1.73_m2} (ref >59)
Globulin, Total: 2.2 g/dL (ref 1.5–4.5)
Glucose: 93 mg/dL (ref 65–99)
Potassium: 4.9 mmol/L (ref 3.5–5.2)
Sodium: 142 mmol/L (ref 134–144)
Total Bilirubin: 0.2 mg/dL (ref 0.0–1.2)
Total Protein: 6.7 g/dL (ref 6.0–8.5)

## 2013-05-25 LAB — CBC WITH DIFFERENTIAL
Basophils Absolute: 0 10*3/uL (ref 0.0–0.2)
Basos: 0 %
Eos: 3 %
Eosinophils Absolute: 0.2 10*3/uL (ref 0.0–0.4)
HCT: 42.8 % (ref 34.0–46.6)
Hemoglobin: 14.6 g/dL (ref 11.1–15.9)
Immature Grans (Abs): 0 10*3/uL (ref 0.0–0.1)
Immature Granulocytes: 0 %
Lymphocytes Absolute: 2.1 10*3/uL (ref 0.7–3.1)
Lymphs: 36 %
MCH: 29.9 pg (ref 26.6–33.0)
MCHC: 34.1 g/dL (ref 31.5–35.7)
MCV: 88 fL (ref 79–97)
Monocytes Absolute: 0.6 10*3/uL (ref 0.1–0.9)
Monocytes: 11 %
Neutrophils Absolute: 2.9 10*3/uL (ref 1.4–7.0)
Neutrophils Relative %: 50 %
Platelets: 359 10*3/uL (ref 150–379)
RBC: 4.88 x10E6/uL (ref 3.77–5.28)
RDW: 13.7 % (ref 12.3–15.4)
WBC: 5.9 10*3/uL (ref 3.4–10.8)

## 2013-05-25 LAB — LIPID PANEL
Chol/HDL Ratio: 7.6 ratio units — ABNORMAL HIGH (ref 0.0–4.4)
Cholesterol, Total: 272 mg/dL — ABNORMAL HIGH (ref 100–199)
HDL: 36 mg/dL — ABNORMAL LOW (ref >39)
LDL Calculated: 204 mg/dL — ABNORMAL HIGH (ref 0–99)
Triglycerides: 162 mg/dL — ABNORMAL HIGH (ref 0–149)
VLDL Cholesterol Cal: 32 mg/dL (ref 5–40)

## 2013-05-25 LAB — HEMOGLOBIN A1C
Est. average glucose Bld gHb Est-mCnc: 134 mg/dL
Hgb A1c MFr Bld: 6.3 % — ABNORMAL HIGH (ref 4.8–5.6)

## 2013-05-27 ENCOUNTER — Telehealth: Payer: Self-pay | Admitting: *Deleted

## 2013-05-27 NOTE — Telephone Encounter (Signed)
Patient called and stated that her BP has not gone down any. States that she currently had an appointment with you and you changed some of her medications and she is concerned it is not helping. Her BP this morning was 169/101. Please Advise.

## 2013-05-28 NOTE — Telephone Encounter (Signed)
Patient called again today. BP is going up today it was 155/98. Patient needs medication increased. Please Advise.

## 2013-05-29 ENCOUNTER — Other Ambulatory Visit: Payer: Self-pay | Admitting: *Deleted

## 2013-05-29 MED ORDER — NEBIVOLOL HCL 5 MG PO TABS: ORAL_TABLET | ORAL | Status: DC

## 2013-05-29 NOTE — Telephone Encounter (Signed)
Let's add bystolic 5mg  daily to her regimen.  We may have samples she can pick up.

## 2013-05-29 NOTE — Telephone Encounter (Signed)
Patient Notified and samples given and medication added to her medication list

## 2013-05-30 ENCOUNTER — Other Ambulatory Visit: Payer: Self-pay | Admitting: *Deleted

## 2013-05-30 MED ORDER — ATORVASTATIN CALCIUM 20 MG PO TABS: 20.0000 mg | ORAL_TABLET | Freq: Every day | ORAL | Status: DC

## 2013-06-02 ENCOUNTER — Encounter: Payer: Self-pay | Admitting: Internal Medicine

## 2013-06-28 ENCOUNTER — Other Ambulatory Visit (HOSPITAL_COMMUNITY): Payer: Self-pay | Admitting: Psychiatry

## 2013-07-04 ENCOUNTER — Ambulatory Visit: Payer: 59 | Admitting: Internal Medicine

## 2013-07-08 ENCOUNTER — Other Ambulatory Visit: Payer: Self-pay | Admitting: *Deleted

## 2013-07-08 MED ORDER — NEBIVOLOL HCL 5 MG PO TABS: ORAL_TABLET | ORAL | Status: DC

## 2013-07-08 NOTE — Telephone Encounter (Signed)
Patient requested 

## 2013-09-09 ENCOUNTER — Encounter: Payer: Self-pay | Admitting: Internal Medicine

## 2013-09-09 ENCOUNTER — Ambulatory Visit (INDEPENDENT_AMBULATORY_CARE_PROVIDER_SITE_OTHER): Payer: 59 | Admitting: Internal Medicine

## 2013-09-09 VITALS — BP 142/88 | HR 81 | Temp 97.8°F | Wt 186.2 lb

## 2013-09-09 DIAGNOSIS — N643 Galactorrhea not associated with childbirth: Secondary | ICD-10-CM

## 2013-09-09 DIAGNOSIS — M810 Age-related osteoporosis without current pathological fracture: Secondary | ICD-10-CM

## 2013-09-09 DIAGNOSIS — M170 Bilateral primary osteoarthritis of knee: Secondary | ICD-10-CM

## 2013-09-09 DIAGNOSIS — M1710 Unilateral primary osteoarthritis, unspecified knee: Secondary | ICD-10-CM

## 2013-09-09 DIAGNOSIS — R7309 Other abnormal glucose: Secondary | ICD-10-CM

## 2013-09-09 DIAGNOSIS — F319 Bipolar disorder, unspecified: Secondary | ICD-10-CM

## 2013-09-09 DIAGNOSIS — R739 Hyperglycemia, unspecified: Secondary | ICD-10-CM

## 2013-09-09 DIAGNOSIS — Z23 Encounter for immunization: Secondary | ICD-10-CM

## 2013-09-09 DIAGNOSIS — G4700 Insomnia, unspecified: Secondary | ICD-10-CM

## 2013-09-09 MED ORDER — ZOLPIDEM TARTRATE 10 MG PO TABS: 5.0000 mg | ORAL_TABLET | Freq: Every evening | ORAL | Status: DC | PRN

## 2013-09-09 NOTE — Addendum Note (Signed)
Addended by: Konrad Saha on: 09/09/2013 10:45 AM   Modules accepted: Orders

## 2013-09-09 NOTE — Progress Notes (Signed)
Patient ID: Brittney [facility], female   DOB: 1945-04-02, 68 y.o.   MRN: [id number]   Location:  [facility]  Code Status: discuss again next time  Allergies  Allergen Reactions  . Sulfur     Unable to recall type pf reaction   . Nickel     Chief Complaint  Patient presents with  . Follow-up    6 week f/u & discuss genetic test results  . Knee Pain    pain/swelling in both knees, haven't had X-rays done yet.  . Breast Discharge    RT nipple discharge 1-2 times every few weeks    HPI: Patient is a 68 y.o. black female seen in the office today for 6 week f/u.    Pain and swelling of knees persists but hasn't had the xrays I ordered.    Gained 7 lbs in 4 months.  Says she does not have willpower lately.    Has had right nipple discharge 1-2 x every few weeks.  Just started "recently".  Does not wear bra at home.  Can tell where breasts leaked.  Clear liquid.  No pain.  No swelling.  Has had lumps, but mammogram was not remarkable. Has had negative biopsies in the past that have been negative.    Has osteoporosis on bone density from 12/14.  Still smoking, but less.  Does drink a lot of milk and eat ice cream.  Does have acid reflux also.  Discussed weight bearing exercise.    A few years ago, had to have esophagus stretched.  Dr. Michail Sermon at [facility] did that.    Review of Systems:  Review of Systems  Constitutional: Negative for fever and chills.       Weight gain  HENT: Negative for congestion.   Eyes: Negative for blurred vision.  Respiratory: Negative for shortness of breath.   Cardiovascular: Negative for chest pain.       Galactorrhea  Gastrointestinal: Negative for abdominal pain.  Genitourinary: Negative for dysuria.  Musculoskeletal: Positive for joint pain.       Bilateral knees  Skin: Negative for rash.  Neurological: Negative for dizziness, loss of consciousness and headaches.  Psychiatric/Behavioral: Positive for  depression. Negative for memory loss.       Bipolar well controlled     Past Medical History  Diagnosis Date  . Hypertension   . Hyperlipemia   . Generalized headaches   . Arthritis   . Depression     Past Surgical History  Procedure Laterality Date  . Abdominal hysterectomy  1980  . Ankle fracture surgery  2011    Left   . Appendectomy  1980    Social History:   reports that she has been smoking Cigarettes.  She has a 15 pack-year smoking history. She does not have any smokeless tobacco history on file. She reports that she drinks alcohol. She reports that she does not use illicit drugs.  Family History  Problem Relation Age of Onset  . Cancer Mother   . Heart disease Mother   . Hypertension Mother   . Hyperlipidemia Mother   . Diabetes Other     Grand-daughter  . Depression Sister   . Depression Sister     Medications: Patient's Medications  New Prescriptions   No medications on file  Previous Medications   ARIPIPRAZOLE (ABILIFY) 5 MG TABLET    Take 1 tablet (5 mg total) by mouth daily.   ATORVASTATIN (LIPITOR) 20 MG  TABLET    Take 1 tablet (20 mg total) by mouth daily. Take one tablet by mouth daily for cholesterol   BUPROPION (WELLBUTRIN XL) 300 MG 24 HR TABLET    Take 1 tablet (300 mg total) by mouth daily.   CHOLECALCIFEROL (VITAMIN D3) 2000 UNITS CAPSULE    Take 1 capsule (2,000 Units total) by mouth daily.   NEBIVOLOL (BYSTOLIC) 5 MG TABLET    Take one tablet by mouth once daily to control blood pressure   VALSARTAN (DIOVAN) 320 MG TABLET    Take 1 tablet (320 mg total) by mouth daily.   ZOLPIDEM (AMBIEN) 10 MG TABLET    Take 0.5 tablets (5 mg total) by mouth at bedtime as needed for sleep.  Modified Medications   No medications on file  Discontinued Medications   No medications on file     Physical Exam: Filed Vitals:   09/09/13 0914  BP: 142/88  Pulse: 81  Temp: 97.8 F (36.6 C)  TempSrc: Oral  Weight: 186 lb 3.2 oz (84.46 kg)  SpO2: 97%    Physical Exam  Constitutional: She appears well-developed and well-nourished. No distress.  Cardiovascular: Normal rate, regular rhythm, normal heart sounds and intact distal pulses.   Pulmonary/Chest: Effort normal and breath sounds normal. No respiratory distress. Right breast exhibits nipple discharge and tenderness. Right breast exhibits no inverted nipple, no mass and no skin change. Left breast exhibits no inverted nipple, no mass, no nipple discharge, no skin change and no tenderness. Breasts are symmetrical. There is no breast swelling.  Clear to milky white drainage from right nipple  Abdominal: Soft. Bowel sounds are normal. She exhibits no distension and no mass. There is no tenderness.  Genitourinary: No breast bleeding.     Labs reviewed: Basic Metabolic Panel:  Recent Labs  11/15/12 1011 12/28/12 0943 05/24/13 1113  NA 142 143 142  K 4.0 4.0 4.9  CL 99 103 104  CO2 25 19 16*  GLUCOSE 101* 75 93  BUN 10 8 17   CREATININE 0.71 0.74 0.77  CALCIUM 10.0 9.8 9.9   Liver Function Tests:  Recent Labs  11/15/12 1011 05/24/13 1113  AST 15 16  ALT 15 17  ALKPHOS 106 86  BILITOT 0.3 0.2  PROT 6.6 6.7  CBC:  Recent Labs  10/06/12 1701 11/15/12 1011 05/24/13 1113  WBC  --  7.0 5.9  NEUTROABS  --  4.6 2.9  HGB 15.6* 15.0 14.6  HCT 46.0 43.6 42.8  MCV  --  87 88  PLT  --  329 359   Lipid Panel:  Recent Labs  11/15/12 1011 12/28/12 0943 05/24/13 1113  HDL 33* 45 36*  LDLCALC 160* 137* 204*  TRIG 191* 172* 162*  CHOLHDL 7.0* 4.8* 7.6*   Lab Results  Component Value Date   HGBA1C 6.3* 05/24/2013   Assessment/Plan 1. Insomnia - does not do well w/o ambien for sleep -has not been suffering side effects despite her age - zolpidem (AMBIEN) 10 MG tablet; Take 0.5 tablets (5 mg total) by mouth at bedtime as needed for sleep.  Dispense: 30 tablet; Refill: 5  2. Primary osteoarthritis of both knees -advised she must get her xrays done so maybe I can do  knee injections - Basic metabolic panel; Future  3. Senile osteoporosis - on bone density in 12/14 -encouraged weighbearing exercise and adequate ca with D in diet -will check d level so see if she needs otc supplement - CBC With differential/Platelet; Future -  Basic metabolic panel; Future - Vitamin D, 25-hydroxy; Future  4. Need for vaccination with 13-polyvalent pneumococcal conjugate vaccine -prevnar given today  5. Galactorrhea of right breast -suspect this is due to her abilify -has no other breast symptoms and had recent mammo and [place] in dec that were unremarkable -advised to stop abilify and f/u with Dr. Casimiro Needle asap (will copy him on my note)  6. Hyperglycemia - f/u labs -not compliant lately with diet and exercise - Hemoglobin A1c; Future - Lipid panel; Future  7. Bipolar disorder, unspecified - stop abilify as above - CBC With differential/Platelet; Future - Basic metabolic panel; Future - Hemoglobin A1c; Future  Labs/tests ordered:   Orders Placed This Encounter  Procedures  . CBC With differential/Platelet    Standing Status: Future     Number of Occurrences:      Standing Expiration Date: 12/10/2013  . Basic metabolic panel    Standing Status: Future     Number of Occurrences:      Standing Expiration Date: 12/10/2013  . Hemoglobin A1c    Standing Status: Future     Number of Occurrences:      Standing Expiration Date: 12/10/2013  . Lipid panel    Standing Status: Future     Number of Occurrences:      Standing Expiration Date: 12/10/2013  . Vitamin D, 25-hydroxy    Standing Status: Future     Number of Occurrences:      Standing Expiration Date: 12/11/2014    Next appt:  3 mos, labs next week though

## 2013-09-09 NOTE — Patient Instructions (Signed)
Get your knee xrays  Call Dr. Michail Sermon about your esophagus  Also, please see Dr. Casimiro Needle again b/c I am stopping your abilify due to galactorrhea.

## 2013-09-17 ENCOUNTER — Other Ambulatory Visit: Payer: 59

## 2013-09-25 ENCOUNTER — Other Ambulatory Visit: Payer: Self-pay | Admitting: Internal Medicine

## 2013-12-01 NOTE — Progress Notes (Signed)
Patient ID: Brittney [facility], female   DOB: Aug 05, 1945, 68 y.o.   MRN: [id number] Pt was given clonidine here in the office to lower her bp with improvement.

## 2013-12-23 ENCOUNTER — Ambulatory Visit: Payer: 59 | Admitting: Internal Medicine

## 2013-12-23 DIAGNOSIS — Z0289 Encounter for other administrative examinations: Secondary | ICD-10-CM

## 2014-01-03 ENCOUNTER — Other Ambulatory Visit: Payer: Self-pay

## 2014-01-03 DIAGNOSIS — Z1231 Encounter for screening mammogram for malignant neoplasm of breast: Secondary | ICD-10-CM

## 2014-01-16 ENCOUNTER — Ambulatory Visit (INDEPENDENT_AMBULATORY_CARE_PROVIDER_SITE_OTHER): Payer: 59 | Admitting: Internal Medicine

## 2014-01-16 ENCOUNTER — Encounter: Payer: Self-pay | Admitting: Internal Medicine

## 2014-01-16 VITALS — BP 148/78 | HR 98 | Temp 98.6°F | Resp 18 | Ht 62.0 in | Wt 185.2 lb

## 2014-01-16 DIAGNOSIS — I10 Essential (primary) hypertension: Secondary | ICD-10-CM

## 2014-01-16 DIAGNOSIS — E785 Hyperlipidemia, unspecified: Secondary | ICD-10-CM

## 2014-01-16 DIAGNOSIS — F3181 Bipolar II disorder: Secondary | ICD-10-CM

## 2014-01-16 DIAGNOSIS — F489 Nonpsychotic mental disorder, unspecified: Secondary | ICD-10-CM

## 2014-01-16 DIAGNOSIS — N3941 Urge incontinence: Secondary | ICD-10-CM

## 2014-01-16 DIAGNOSIS — F5105 Insomnia due to other mental disorder: Secondary | ICD-10-CM

## 2014-01-16 DIAGNOSIS — M170 Bilateral primary osteoarthritis of knee: Secondary | ICD-10-CM

## 2014-01-16 DIAGNOSIS — M810 Age-related osteoporosis without current pathological fracture: Secondary | ICD-10-CM

## 2014-01-16 DIAGNOSIS — R739 Hyperglycemia, unspecified: Secondary | ICD-10-CM

## 2014-01-16 MED ORDER — MIRABEGRON ER 25 MG PO TB24: 25.0000 mg | ORAL_TABLET | Freq: Every day | ORAL | Status: DC

## 2014-01-16 NOTE — Progress Notes (Signed)
Patient ID: Brittney [facility], female   DOB: 08/07/1945, 68 y.o.   MRN: [id number]   Location:  [facility] [place]  Code Status: DNR, given form for advance directive to complete  Allergies  Allergen Reactions  . Sulfur     Unable to recall type pf reaction   . Nickel     Chief Complaint  Patient presents with  . Medical Management of Chronic Issues    headache back of head, congestion/wheezing at night    HPI: Patient is a 68 y.o.  seen in the office today for 3 month f/u.    She c/o a headache in the back of her head--left side at base of skull--lasts for 30 mins or so, will take aspirin. Now having congestion/wheezing at night in chest.  Coughing some.    Couldn't fill ambien.  Says therapist will give her something for sleep over there.  Getting psychotherapy at Dr. Mila [facility] at [facility].  They may try her on lithium.  They feel she has bipolar 2.    Is on bystolic.  Cannot afford to refill and requests bystolic samples.    She is still smoking.  Says she needs to quit.    Knees not bothering her so much.  Walks her dog quite a bit.   Has her screening mammo 12/14 scheduled.  Had flu shot this am.  Review of Systems:  Review of Systems  Constitutional: Negative for fever, chills and malaise/fatigue.  HENT: Positive for congestion.   Eyes: Negative for blurred vision.  Respiratory: Positive for cough and wheezing. Negative for shortness of breath.   Cardiovascular: Negative for chest pain and leg swelling.  Gastrointestinal: Negative for abdominal pain.  Genitourinary: Negative for dysuria.  Musculoskeletal: Negative for joint pain and falls.  Skin: Negative for rash.  Neurological: Positive for headaches. Negative for dizziness and weakness.  Psychiatric/Behavioral: Positive for depression. Negative for memory loss. The patient has insomnia.      Past Medical History  Diagnosis Date  . Hypertension   . Hyperlipemia   .  Generalized headaches   . Arthritis   . Depression     Past Surgical History  Procedure Laterality Date  . Abdominal hysterectomy  1980  . Ankle fracture surgery  2011    Left   . Appendectomy  1980    Social History:   reports that she has been smoking Cigarettes.  She has a 15 pack-year smoking history. She does not have any smokeless tobacco history on file. She reports that she drinks alcohol. She reports that she does not use illicit drugs.  Family History  Problem Relation Age of Onset  . Cancer Mother   . Heart disease Mother   . Hypertension Mother   . Hyperlipidemia Mother   . Diabetes Other     Grand-daughter  . Depression Sister   . Depression Sister     Medications: Patient's Medications  New Prescriptions   No medications on file  Previous Medications   ARIPIPRAZOLE (ABILIFY) 5 MG TABLET    Take 1 tablet (5 mg total) by mouth daily.   ATORVASTATIN (LIPITOR) 20 MG TABLET    TAKE 1 TABLET BY MOUTH EVERY DAY FOR CHOLESTEROL   BUPROPION (WELLBUTRIN XL) 300 MG 24 HR TABLET    Take 1 tablet (300 mg total) by mouth daily.   CHOLECALCIFEROL (VITAMIN D3) 2000 UNITS CAPSULE    Take 1 capsule (2,000 Units total) by mouth daily.  NEBIVOLOL (BYSTOLIC) 5 MG TABLET    Take one tablet by mouth once daily to control blood pressure   VALSARTAN (DIOVAN) 320 MG TABLET    TAKE 1 TABLET BY MOUTH EVERY DAY   ZOLPIDEM (AMBIEN) 10 MG TABLET    Take 0.5 tablets (5 mg total) by mouth at bedtime as needed for sleep.  Modified Medications   No medications on file  Discontinued Medications   No medications on file     Physical Exam: Filed Vitals:   01/16/14 0802  BP: 148/78  Pulse: 98  Temp: 98.6 F (37 C)  TempSrc: Oral  Resp: 18  Height: 5\' 2"  (1.575 m)  Weight: 185 lb 3.2 oz (84.006 kg)  SpO2: 97%  Physical Exam  Constitutional: She is oriented to person, place, and time. She appears well-developed and well-nourished. No distress.  Cardiovascular: Normal rate, regular  rhythm, normal heart sounds and intact distal pulses.   Pulmonary/Chest: Effort normal. No respiratory distress. She has wheezes. She has no rales.  Abdominal: Soft. Bowel sounds are normal. She exhibits no distension. There is no tenderness.  Musculoskeletal: Normal range of motion.  Neurological: She is alert and oriented to person, place, and time.  Skin: Skin is warm and dry.    Labs reviewed: Basic Metabolic Panel:  Recent Labs  16/10/96 1113  NA 142  K 4.9  CL 104  CO2 16*  GLUCOSE 93  BUN 17  CREATININE 0.77  CALCIUM 9.9   Liver Function Tests:  Recent Labs  05/24/13 1113  AST 16  ALT 17  ALKPHOS 86  BILITOT 0.2  PROT 6.7  CBC:  Recent Labs  05/24/13 1113  WBC 5.9  NEUTROABS 2.9  HGB 14.6  HCT 42.8  MCV 88  PLT 359   Lipid Panel:  Recent Labs  05/24/13 1113  HDL 36*  LDLCALC 204*  TRIG 162*  CHOLHDL 7.6*   Lab Results  Component Value Date   HGBA1C 6.3* 05/24/2013    Assessment/Plan 1. Essential hypertension, benign -bp slightly above goal today, but out of bystolic and smoked before coming in -given a full month supply of bystolic 5mg  samples (otherwise cannot afford and this has been effective)  2. Primary osteoarthritis of both knees - improved with walking her dog more  3. Insomnia due to mental disorder -is being managed by [facility]?  Psych services -she has now been officially diagnosed with bipolar 2 and this is related to that  4. Urge incontinence - is willing to try myrbetriq for this--given the 30 day coupon to try this to decrease her urgency and frequency of episodes - mirabegron ER (MYRBETRIQ) 25 MG TB24 tablet; Take 1 tablet (25 mg total) by mouth daily.  Dispense: 30 tablet; Refill: 3  5. Hyperglycemia -f/u hba1c, bmp today -cont diet and exercise  6. Hyperlipemia -f/u flp today and cont diet and exercise  7. Bipolar 2 disorder -psychiatry managing--she says they may try her on lithium for this -advised  to be sure her Na and cr are monitored closely on this  Labs/tests ordered:  Has labs ordered from July--will do today  Next appt:  4 mos  Can get prevnar July 2016 or later  Brittney [facility], D.O. Geriatrics [facility] [address], [place] [address] Cell Phone (Mon-Fri 8am-5pm):  [phone] On Call:  [phone] & follow prompts after 5pm & weekends Office Phone:  [phone] Office Fax:  [phone]

## 2014-01-17 LAB — VITAMIN D 25 HYDROXY (VIT D DEFICIENCY, FRACTURES): Vit D, 25-Hydroxy: 12.3 ng/mL — ABNORMAL LOW (ref 30.0–100.0)

## 2014-02-07 ENCOUNTER — Encounter: Payer: Self-pay | Admitting: Internal Medicine

## 2014-02-07 ENCOUNTER — Ambulatory Visit (INDEPENDENT_AMBULATORY_CARE_PROVIDER_SITE_OTHER): Payer: 59 | Admitting: Internal Medicine

## 2014-02-07 VITALS — BP 138/80 | HR 73 | Temp 97.8°F | Resp 10 | Ht 62.0 in | Wt 186.6 lb

## 2014-02-07 DIAGNOSIS — R609 Edema, unspecified: Secondary | ICD-10-CM

## 2014-02-07 DIAGNOSIS — M79622 Pain in left upper arm: Secondary | ICD-10-CM

## 2014-02-07 DIAGNOSIS — I10 Essential (primary) hypertension: Secondary | ICD-10-CM

## 2014-02-07 NOTE — Progress Notes (Signed)
Patient ID: Brittney [facility], female   DOB: December 21, 1945, 68 y.o.   MRN: [id number]   Location:  [facility]  Code Status: full code  Allergies  Allergen Reactions  . Sulfur     Unable to recall type pf reaction   . Nickel     Chief Complaint  Patient presents with  . Acute Visit    "Feels bloated all over" worse in ankles. Discuss rx for fluid pill   . Acute Visit    Left shoulder pain since flu vaccine     HPI: Patient is a 68 y.o. black female seen in the office today for an acute visit due to two new complaints:  Feels bloated and swollen all over.  Has had it off and on.  Started a couple of days ago.  More short of breath also.  Says 68 yo flipped a bad switch.  No chest pains or palpitations.  Does not particularly improve over night.  Did take hctz in [place] 9 years ago, but none since lived here.    Left shoulder pain since flu shot.  Sore and tender radiating down upper arm.  Feels like a little pinch.  No big bruise afterwards.  Discussed possible nerve pain if nerve injured during vaccine vs. Neck pain vs. related to her heart.  Getting her mammogram done Monday.  Review of Systems:  Review of Systems  Constitutional: Negative for fever and malaise/fatigue.  HENT: Positive for congestion.   Eyes: Negative for blurred vision.  Respiratory: Positive for shortness of breath and wheezing. Negative for cough and sputum production.   Cardiovascular: Positive for leg swelling. Negative for chest pain, palpitations, orthopnea, claudication and PND.  Gastrointestinal: Negative for abdominal pain.  Genitourinary: Negative for dysuria.  Musculoskeletal: Negative for myalgias and falls.       Left upper arm pinching pain;  Occasional left neck pain also  Skin: Negative for rash.  Neurological: Negative for dizziness, weakness and headaches.  Endo/Heme/Allergies: Does not bruise/bleed easily.  Psychiatric/Behavioral: Positive for  depression. Negative for memory loss.       Mood much improved since meds changed     Past Medical History  Diagnosis Date  . Hypertension   . Hyperlipemia   . Generalized headaches   . Arthritis   . Depression     Past Surgical History  Procedure Laterality Date  . Abdominal hysterectomy  1980  . Ankle fracture surgery  2011    Left   . Appendectomy  1980    Social History:   reports that she has been smoking Cigarettes.  She has a 15 pack-year smoking history. She does not have any smokeless tobacco history on file. She reports that she drinks alcohol. She reports that she does not use illicit drugs.  Family History  Problem Relation Age of Onset  . Cancer Mother   . Heart disease Mother   . Hypertension Mother   . Hyperlipidemia Mother   . Diabetes Other     Grand-daughter  . Depression Sister   . Depression Sister     Medications: Patient's Medications  New Prescriptions   No medications on file  Previous Medications   ARIPIPRAZOLE (ABILIFY) 5 MG TABLET    Take 1 tablet (5 mg total) by mouth daily.   ATORVASTATIN (LIPITOR) 20 MG TABLET    TAKE 1 TABLET BY MOUTH EVERY DAY FOR CHOLESTEROL   BUPROPION (WELLBUTRIN XL) 300 MG 24 HR TABLET  Take 1 tablet (300 mg total) by mouth daily.   CHOLECALCIFEROL (VITAMIN D3) 2000 UNITS CAPSULE    Take 1 capsule (2,000 Units total) by mouth daily.   CLONAZEPAM (KLONOPIN) 1 MG TABLET    Take 1 mg by mouth at bedtime.   LAMOTRIGINE (LAMICTAL) 25 MG TABLET    Take 25 mg by mouth daily.   MIRABEGRON ER (MYRBETRIQ) 25 MG TB24 TABLET    Take 1 tablet (25 mg total) by mouth daily.   NEBIVOLOL (BYSTOLIC) 5 MG TABLET    Take one tablet by mouth once daily to control blood pressure   VALSARTAN (DIOVAN) 320 MG TABLET    TAKE 1 TABLET BY MOUTH EVERY DAY  Modified Medications   No medications on file  Discontinued Medications   No medications on file   Physical Exam: Filed Vitals:   02/07/14 1119  BP: 138/80  Pulse: 73  Temp: 97.8  F (36.6 C)  TempSrc: Oral  Resp: 10  Height: 5\' 2"  (1.575 m)  Weight: 186 lb 9.6 oz (84.641 kg)  SpO2: 97%  Physical Exam  Constitutional: She is oriented to person, place, and time. She appears well-developed and well-nourished. No distress.  Cardiovascular: Normal rate, regular rhythm, normal heart sounds and intact distal pulses.   Bilateral 2+ pitting edema of legs;  Appears to have some ascites as well  Pulmonary/Chest: Effort normal and breath sounds normal. No respiratory distress. She has no wheezes. She has no rales.  Abdominal: Soft. Bowel sounds are normal. She exhibits distension. She exhibits no mass. There is no tenderness.  Musculoskeletal: Normal range of motion.  No reproducible tenderness of neck or left shoulder/upper arm  Neurological: She is alert and oriented to person, place, and time.  Skin: Skin is warm and dry.  Psychiatric: She has a normal mood and affect.  Brighter spirits today despite not feeling well    Labs reviewed: Basic Metabolic Panel:  Recent Labs  60/10/93 1113  NA 142  K 4.9  CL 104  CO2 16*  GLUCOSE 93  BUN 17  CREATININE 0.77  CALCIUM 9.9   Liver Function Tests:  Recent Labs  05/24/13 1113  AST 16  ALT 17  ALKPHOS 86  BILITOT 0.2  PROT 6.7   No results for input(s): LIPASE, AMYLASE in the last 8760 hours. No results for input(s): AMMONIA in the last 8760 hours. CBC:  Recent Labs  05/24/13 1113  WBC 5.9  NEUTROABS 2.9  HGB 14.6  HCT 42.8  MCV 88  PLT 359   Lipid Panel:  Recent Labs  05/24/13 1113  HDL 36*  LDLCALC 204*  TRIG 162*  CHOLHDL 7.6*   Lab Results  Component Value Date   HGBA1C 6.3* 05/24/2013   EKG:  NSR at 68 with mildly worse T wave inversions in lateral leads and flattening in anterior leads vs. Prior EKG  Also reviewed 2007 stress myoview with was normal with hyperdynamic LV.  Assessment/Plan 1. Edema -reviewed potential typical causes:  Sodium in diet, venous insuffiency,  medication (newly added lamictal), chf, renal failure, hepatic failure - EKG 12-Lead not significantly changed -obtain labs: - Hepatic Function Panel - Brain Natriuretic Peptide - CBC With differential/Platelet - Basic metabolic panel -if labs are normal, I will start her on a diuretic and recheck her stress myoview at cardiology 2. Essential hypertension, benign -bp improved today with bystolic I previously added - Brain Natriuretic Peptide - Basic metabolic panel  3. Left upper arm pain -my concern  was that this was cardiac and all of her symptoms were connected, but could also be neuropathic from vaccine injuring nerve or from her left neck which does give her discomfort at times--will let me know if this becomes more consistent or worse -advised to call 911 if she has chest pain, dyspnea, diaphoresis and pain in her arm/neck -cont current bp and lipid regimen - Brain Natriuretic Peptide - CBC With differential/Platelet - Basic metabolic panel  Labs/tests ordered:   Orders Placed This Encounter  Procedures  . Hepatic Function Panel  . Brain Natriuretic Peptide  . CBC With differential/Platelet  . Basic metabolic panel  . EKG 12-Lead    Next appt:  1 month  Tiburcio Linder L. Abel Ra, D.O. Geriatrics [facility] [address], [place] [address] Cell Phone (Mon-Fri 8am-5pm):  [phone] On Call:  [phone] & follow prompts after 5pm & weekends Office Phone:  [phone] Office Fax:  [phone]

## 2014-02-08 LAB — CBC WITH DIFFERENTIAL
Basophils Absolute: 0 10*3/uL (ref 0.0–0.2)
Basos: 0 %
Eos: 2 %
Eosinophils Absolute: 0.1 10*3/uL (ref 0.0–0.4)
HCT: 43 % (ref 34.0–46.6)
Hemoglobin: 14 g/dL (ref 11.1–15.9)
Immature Grans (Abs): 0 10*3/uL (ref 0.0–0.1)
Immature Granulocytes: 0 %
Lymphocytes Absolute: 1.9 10*3/uL (ref 0.7–3.1)
Lymphs: 29 %
MCH: 29.1 pg (ref 26.6–33.0)
MCHC: 32.6 g/dL (ref 31.5–35.7)
MCV: 89 fL (ref 79–97)
Monocytes Absolute: 0.4 10*3/uL (ref 0.1–0.9)
Monocytes: 5 %
Neutrophils Absolute: 4.2 10*3/uL (ref 1.4–7.0)
Neutrophils Relative %: 64 %
Platelets: 268 10*3/uL (ref 150–379)
RBC: 4.81 x10E6/uL (ref 3.77–5.28)
RDW: 14.1 % (ref 12.3–15.4)
WBC: 6.5 10*3/uL (ref 3.4–10.8)

## 2014-02-08 LAB — BRAIN NATRIURETIC PEPTIDE: BNP: 18.5 pg/mL (ref 0.0–100.0)

## 2014-02-08 LAB — HEPATIC FUNCTION PANEL
ALT: 16 IU/L (ref 0–32)
AST: 14 IU/L (ref 0–40)
Albumin: 4 g/dL (ref 3.6–4.8)
Alkaline Phosphatase: 105 IU/L (ref 39–117)
Bilirubin, Direct: 0.11 mg/dL (ref 0.00–0.40)
Total Bilirubin: 0.3 mg/dL (ref 0.0–1.2)
Total Protein: 6.3 g/dL (ref 6.0–8.5)

## 2014-02-08 LAB — BASIC METABOLIC PANEL
BUN/Creatinine Ratio: 14 (ref 11–26)
BUN: 12 mg/dL (ref 8–27)
CO2: 22 mmol/L (ref 18–29)
Calcium: 9.6 mg/dL (ref 8.7–10.3)
Chloride: 104 mmol/L (ref 97–108)
Creatinine, Ser: 0.83 mg/dL (ref 0.57–1.00)
GFR calc Af Amer: 84 mL/min/{1.73_m2} (ref >59)
GFR calc non Af Amer: 73 mL/min/{1.73_m2} (ref >59)
Glucose: 91 mg/dL (ref 65–99)
Potassium: 3.8 mmol/L (ref 3.5–5.2)
Sodium: 141 mmol/L (ref 134–144)

## 2014-02-10 ENCOUNTER — Ambulatory Visit
Admission: RE | Admit: 2014-02-10 | Discharge: 2014-02-10 | Disposition: A | Discharge status: Home / Self Care | Payer: PRIVATE HEALTH INSURANCE | Source: Ambulatory Visit

## 2014-02-10 DIAGNOSIS — Z1231 Encounter for screening mammogram for malignant neoplasm of breast: Secondary | ICD-10-CM

## 2014-02-11 ENCOUNTER — Telehealth: Payer: Self-pay | Admitting: *Deleted

## 2014-02-11 ENCOUNTER — Other Ambulatory Visit: Payer: Self-pay | Admitting: *Deleted

## 2014-02-11 DIAGNOSIS — I10 Essential (primary) hypertension: Secondary | ICD-10-CM

## 2014-02-11 DIAGNOSIS — E785 Hyperlipidemia, unspecified: Secondary | ICD-10-CM

## 2014-02-11 DIAGNOSIS — R079 Chest pain, unspecified: Secondary | ICD-10-CM

## 2014-02-11 MED ORDER — HYDROCHLOROTHIAZIDE 12.5 MG PO TABS: ORAL_TABLET | ORAL | Status: DC

## 2014-02-11 NOTE — Telephone Encounter (Signed)
Spoke with patient regarding lab results and referral, medication changes. She stated that she understood the medcation changes and would like to see a cardiologist with the [facility].

## 2014-02-11 NOTE — Telephone Encounter (Signed)
Patient informed of mammogram results, she states that she understood the results.

## 2014-02-11 NOTE — Telephone Encounter (Signed)
-----   Message from Gayland Curry, DO sent at 02/10/2014  1:26 PM EST ----- Mammogram was normal

## 2014-02-11 NOTE — Telephone Encounter (Signed)
-----   Message from Gayland Curry, DO sent at 02/10/2014  9:37 AM EST ----- Her labs were all normal including her liver and kidneys as well as the heart failure marker.   I would like her to start hctz 12.5mg  daily for her edema and recheck bmp and fasting lipids in about 1 week (to check sodium and kidneys after beginning a diuretic). I would also like to refer her to cardiology for a stress test due to her chest pain (she smokes and is overweight with htn, hyperlipidemia on medications.  She last had one that was normal in 2007 by Dr. Montez Morita.

## 2014-03-07 ENCOUNTER — Encounter: Payer: Self-pay | Admitting: Cardiology

## 2014-03-07 ENCOUNTER — Ambulatory Visit (INDEPENDENT_AMBULATORY_CARE_PROVIDER_SITE_OTHER): Payer: Medicare Other | Admitting: Cardiology

## 2014-03-07 VITALS — BP 146/92 | HR 87 | Ht 62.0 in | Wt 187.0 lb

## 2014-03-07 DIAGNOSIS — Z720 Tobacco use: Secondary | ICD-10-CM

## 2014-03-07 DIAGNOSIS — I10 Essential (primary) hypertension: Secondary | ICD-10-CM

## 2014-03-07 DIAGNOSIS — R9431 Abnormal electrocardiogram [ECG] [EKG]: Secondary | ICD-10-CM

## 2014-03-07 DIAGNOSIS — R0609 Other forms of dyspnea: Secondary | ICD-10-CM | POA: Diagnosis not present

## 2014-03-07 NOTE — Progress Notes (Signed)
Brittney Lang Date of Birth:  12-Oct-1945 [facility] [address] [place], [place]  [address] [phone]        Fax   [phone]   History of Present Illness: This pleasant 69 year old woman is seen by me for the first time today.  She is a medical patient of Dr. Hollace Kinnier.  We are asked to see the patient because of exertional dyspnea and peripheral edema.  She has also had an abnormal EKG recently.  She does not have any history of known ischemic heart disease.  She had a treadmill test in 2007 elsewhere which was normal.  He has been experiencing shortness of breath climbing stairs.  She has not been aware of any racing of her heart.  The patient is not diabetic.  She does smoke a half pack of cigarettes daily.  He has a history of hypercholesterolemia and is on statin therapy. She does not get any regular exercise.  She is retired on disability.  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 She has a history of bipolar disorder and is followed by psychiatry She states that she has a history of heart trouble which runs in her family affecting many cousins and aunts some of whom have had to have stents. Her social history reveals that she is a widow.  She spent most of her life in [place].  She moved here after she retired to be near siblings.  However her son and grandchildren live in [place] and she is considering moving back there.  Current Outpatient Prescriptions  Medication Sig Dispense Refill  . atorvastatin (LIPITOR) 20 MG tablet TAKE 1 TABLET BY MOUTH EVERY DAY FOR CHOLESTEROL 30 tablet 2  . Cholecalciferol (VITAMIN D3) 2000 UNITS capsule Take 1 capsule (2,000 Units total) by mouth daily. 30 capsule 3  . clonazePAM (KLONOPIN) 1 MG tablet Take 1 mg by mouth at bedtime.  0  . FLUoxetine (PROZAC) 10 MG tablet Take 10 mg by mouth daily.    . hydrochlorothiazide (HYDRODIURIL) 12.5 MG tablet Take 1 tablet daily for edema  30 tablet 3  . lamoTRIgine (LAMICTAL) 25 MG tablet Take 25 mg by mouth daily.  0  . mirabegron ER (MYRBETRIQ) 25 MG TB24 tablet Take 1 tablet (25 mg total) by mouth daily. 30 tablet 3  . nebivolol (BYSTOLIC) 5 MG tablet Take one tablet by mouth once daily to control blood pressure 30 tablet 5  . valsartan (DIOVAN) 320 MG tablet TAKE 1 TABLET BY MOUTH EVERY DAY 30 tablet 2   No current facility-administered medications for this visit.    Allergies  Allergen Reactions  . Sulfur     Unable to recall type pf reaction   . Nickel     Patient Active Problem List   Diagnosis Date Noted  . Abnormal EKG 03/07/2014  . Dyspnea on exertion 03/07/2014  . Insomnia 05/24/2013  . Hyperlipidemia LDL goal < 100 12/28/2012  . Hyperglycemia 12/28/2012  . Essential hypertension, benign 12/28/2012  . 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 Urge incontinence 12/28/2012  . Primary osteoarthritis of both knees 12/28/2012  . Depression   . Arthritis   . Hyperlipemia   . Hypertension     History  Smoking status  . Current Every Day Smoker -- 0.50 packs/day for 30 years  . Types: Cigarettes  Smokeless tobacco  . Not on file    History  Alcohol Use  .  Yes    Comment: occasional beer    Family History  Problem Relation Age of Onset  . Cancer Mother   . Heart disease Mother   . Hypertension Mother   . Hyperlipidemia Mother   . Diabetes Other     Grand-daughter  . Depression Sister   . Depression Sister     Review of Systems: Constitutional: no fever chills diaphoresis or fatigue or change in weight.  Head and neck: no hearing loss, no epistaxis, no photophobia or visual disturbance. Respiratory: Positive for shortness of breath and occasional wheezing. Cardiovascular: No chest pain peripheral edema, palpitations. Gastrointestinal: No abdominal distention, no abdominal pain, no change in bowel habits hematochezia or melena. Genitourinary: No dysuria, no frequency, no urgency, no  nocturia. Musculoskeletal:No arthralgias, no back pain, no gait disturbance or myalgias. Neurological: No dizziness, no headaches, no numbness, no seizures, no syncope, no weakness, no tremors. Hematologic: No lymphadenopathy, no easy bruising. Psychiatric: No confusion, no hallucinations, no sleep disturbance.   Wt Readings from Last 3 Encounters:  03/07/14 187 lb (84.823 kg)  02/07/14 186 lb 9.6 oz (84.641 kg)  01/16/14 185 lb 3.2 oz (84.006 kg)    Physical Exam: Filed Vitals:   03/07/14 1436  BP: 146/92  Pulse: 87  The patient appears to be in no distress.  Head and neck exam reveals that the pupils are equal and reactive.  The extraocular movements are full.  There is no scleral icterus.  Mouth and pharynx are benign.  No lymphadenopathy.  No carotid bruits.  The jugular venous pressure is normal.  Thyroid is not enlarged or tender.  Chest reveals no inspiratory rales.  When she lies down she does have some mild high-pitched expiratory wheezes.  Expansion of the chest is symmetrical.  Heart reveals no abnormal lift or heave.  First and second heart sounds are normal.  There is no murmur gallop rub or click.  The abdomen is soft and nontender.  Bowel sounds are normoactive.  There is no hepatosplenomegaly or mass.  There are no abdominal bruits.  Extremities reveal no phlebitis.  There is trace pretibial and pedal edema.  Pedal pulses are good.  There is no cyanosis or clubbing.  Neurologic exam is normal strength and no lateralizing weakness.  No sensory deficits.  Integument reveals no rash  EKG shows normal sinus rhythm with nonspecific T-wave inversion in the anterolateral leads and is unchanged since 02/07/14.  Assessment / Plan: 1.  Dyspnea on exertion. 2.  Abnormal EKG with new T-wave inversion in the anterolateral leads not present on remote EKG of 06/30/10. 3.  Peripheral edema, improved since starting on HCTZ several weeks ago 4.  Tobacco abuse.  I counseled her in  the importance of smoking cessation. 5.  Hypercholesterolemia on statin therapy 6.  Bipolar disorder  Disposition: Continue current medication.  Obtain chest x-ray.  Return for Lexiscan Myoview stress test. Many thanks for the opportunity to see this pleasant woman with you.  I will be in touch regarding the results of her studies.

## 2014-03-07 NOTE — Patient Instructions (Signed)
A chest x-ray takes a picture of the organs and structures inside the chest, including the heart, lungs, and blood vessels. This test can show several things, including, whether the heart is enlarges; whether fluid is building up in the lungs; and whether pacemaker / defibrillator leads are still in place. [place]  Your physician has requested that you have a lexiscan myoview. For further information please visit [email or web address]. Please follow instruction sheet, as given.  Your physician recommends that you continue on your current medications as directed. Please refer to the Current Medication list given to you today.  FOLLOW UP AS NEEDED

## 2014-03-10 ENCOUNTER — Ambulatory Visit
Admission: RE | Admit: 2014-03-10 | Discharge: 2014-03-10 | Disposition: A | Discharge status: Home / Self Care | Payer: Medicare Other | Source: Ambulatory Visit | Attending: Cardiology | Admitting: Cardiology

## 2014-03-10 DIAGNOSIS — M5134 Other intervertebral disc degeneration, thoracic region: Secondary | ICD-10-CM | POA: Diagnosis not present

## 2014-03-10 DIAGNOSIS — K449 Diaphragmatic hernia without obstruction or gangrene: Secondary | ICD-10-CM | POA: Diagnosis not present

## 2014-03-10 DIAGNOSIS — R0609 Other forms of dyspnea: Principal | ICD-10-CM

## 2014-03-10 DIAGNOSIS — R918 Other nonspecific abnormal finding of lung field: Secondary | ICD-10-CM | POA: Diagnosis not present

## 2014-03-12 ENCOUNTER — Other Ambulatory Visit: Payer: Self-pay | Admitting: *Deleted

## 2014-03-12 ENCOUNTER — Ambulatory Visit (HOSPITAL_COMMUNITY): Payer: Medicare Other | Attending: Cardiovascular Disease | Admitting: Radiology

## 2014-03-12 DIAGNOSIS — R0609 Other forms of dyspnea: Secondary | ICD-10-CM

## 2014-03-12 DIAGNOSIS — E785 Hyperlipidemia, unspecified: Secondary | ICD-10-CM | POA: Diagnosis not present

## 2014-03-12 DIAGNOSIS — R9431 Abnormal electrocardiogram [ECG] [EKG]: Secondary | ICD-10-CM | POA: Insufficient documentation

## 2014-03-12 DIAGNOSIS — I10 Essential (primary) hypertension: Secondary | ICD-10-CM | POA: Diagnosis not present

## 2014-03-12 MED ORDER — TECHNETIUM TC 99M SESTAMIBI GENERIC - CARDIOLITE
33.0000 | Freq: Once | INTRAVENOUS | Status: AC | PRN
  Administered 2014-03-12: 33 via INTRAVENOUS

## 2014-03-12 MED ORDER — DOXYCYCLINE HYCLATE 100 MG PO TABS: 100.0000 mg | ORAL_TABLET | Freq: Two times a day (BID) | ORAL | Status: DC

## 2014-03-12 MED ORDER — REGADENOSON 0.4 MG/5ML IV SOLN
0.4000 mg | Freq: Once | INTRAVENOUS | Status: AC
  Administered 2014-03-12: 0.4 mg via INTRAVENOUS

## 2014-03-12 MED ORDER — TECHNETIUM TC 99M SESTAMIBI GENERIC - CARDIOLITE
11.0000 | Freq: Once | INTRAVENOUS | Status: AC | PRN
  Administered 2014-03-12: 11 via INTRAVENOUS

## 2014-03-12 NOTE — Progress Notes (Signed)
  [facility] [address] [place], [place] [address] [phone]    Cardiology Nuclear Med Study  Brittney Lang is a 69 y.o. female     MRN : [id number]     DOB: 1945-03-14  Procedure Date: 03/12/2014  Nuclear Med Background Indication for Stress Test:  Evaluation for Ischemia and Abnormal EKG History:  No known CAD Cardiac Risk Factors: Hypertension and Lipids  Symptoms:  DOE   Nuclear Pre-Procedure Caffeine/Decaff Intake:  None NPO After: 8:00am   Lungs:  clear O2 Sat: 95% on room air. IV 0.9% NS with Angio Cath:  22g  IV Site: R Hand  IV Started by:  Matilde Haymaker, RN  Chest Size (in):  38 Cup Size: B  Height: 5\' 2"  (1.575 m)  Weight:  186 lb (84.369 kg)  BMI:  Body mass index is 34.01 kg/(m^2). Tech Comments:  No Bystolic x 24 hrs    Nuclear Med Study 1 or 2 day study: 1 day  Stress Test Type:  Treadmill/Lexiscan  Reading MD: n/a  Order Authorizing Provider:  Henrene Dodge  Resting Radionuclide: Technetium 28m Sestamibi  Resting Radionuclide Dose: 11.0 mCi   Stress Radionuclide:  Technetium 81m Sestamibi  Stress Radionuclide Dose: 33.0 mCi           Stress Protocol Rest HR: 73 Stress HR: 100  Rest BP: 128/70 Stress BP: 138/60  Exercise Time (min): n/a METS: n/a   Predicted Max HR: 153 bpm % Max HR: 65.36 bpm Rate Pressure Product: 17200   Dose of Adenosine (mg):  n/a Dose of Lexiscan: 0.4 mg  Dose of Atropine (mg): n/a Dose of Dobutamine: n/a mcg/kg/min (at max HR)  Stress Test Technologist: Glade Lloyd, BS-ES  Nuclear Technologist:  Earl Many, CNMT     Rest Procedure:  Myocardial perfusion imaging was performed at rest 45 minutes following the intravenous administration of Technetium 17m Sestamibi. Rest ECG: Normal sinus rhythm. Nonspecific ST-T wave changes.  Stress Procedure:  The patient received IV Lexiscan 0.4 mg over 15-seconds with concurrent low level exercise and then Technetium 53m  Sestamibi was injected at 30-seconds while the patient continued walking one more minute.  Quantitative spect images were obtained after a 45-minute delay. Stress ECG: No significant change from baseline ECG  QPS Raw Data Images:  Normal; no motion artifact; normal heart/lung ratio. Stress Images:  There is a small area of very mild decreased uptake at the base/mid inferolateral segments. This is fixed. Rest Images:  The rest images are the same as the stress images. Subtraction (SDS):  No evidence of ischemia. Transient Ischemic Dilatation (Normal <1.22):  0.95 Lung/Heart Ratio (Normal <0.45):  0.28  Quantitative Gated Spect Images QGS EDV:  72 ml QGS ESV:  23 ml  Impression Exercise Capacity:  Lexiscan with low level exercise. BP Response:  Normal blood pressure response. Clinical Symptoms:  Patient felt tired from the infusion. ECG Impression:  No significant ST segment change suggestive of ischemia. Comparison with Prior Nuclear Study: No previous nuclear study performed  Overall Impression:  Normal stress nuclear study.  This is a low risk scan. There is no evidence of ischemia. There is slight decreased activity at the base/mid inferolateral wall that is fixed. I suspect that this is not a significant finding.  LV Ejection Fraction: 68%.  LV Wall Motion:  Normal Wall Motion   Dola Argyle, MD

## 2014-03-13 ENCOUNTER — Institutional Professional Consult (permissible substitution): Payer: Self-pay | Admitting: Cardiovascular Disease

## 2014-03-26 ENCOUNTER — Other Ambulatory Visit: Payer: Self-pay | Admitting: Internal Medicine

## 2014-04-03 ENCOUNTER — Other Ambulatory Visit: Payer: Self-pay | Admitting: *Deleted

## 2014-04-03 MED ORDER — ATORVASTATIN CALCIUM 20 MG PO TABS: ORAL_TABLET | ORAL | Status: DC

## 2014-04-03 NOTE — Telephone Encounter (Signed)
[facility] [address]

## 2014-05-22 ENCOUNTER — Ambulatory Visit: Payer: 59 | Admitting: Internal Medicine

## 2014-06-02 ENCOUNTER — Encounter: Payer: Self-pay | Admitting: Internal Medicine

## 2014-06-02 ENCOUNTER — Ambulatory Visit (INDEPENDENT_AMBULATORY_CARE_PROVIDER_SITE_OTHER): Payer: Medicare Other | Admitting: Internal Medicine

## 2014-06-02 VITALS — BP 120/82 | HR 70 | Temp 97.9°F | Resp 20 | Ht 62.0 in | Wt 178.2 lb

## 2014-06-02 DIAGNOSIS — E785 Hyperlipidemia, unspecified: Secondary | ICD-10-CM | POA: Diagnosis not present

## 2014-06-02 DIAGNOSIS — F3181 Bipolar II disorder: Secondary | ICD-10-CM

## 2014-06-02 DIAGNOSIS — R05 Cough: Secondary | ICD-10-CM | POA: Diagnosis not present

## 2014-06-02 DIAGNOSIS — N3941 Urge incontinence: Secondary | ICD-10-CM

## 2014-06-02 DIAGNOSIS — I10 Essential (primary) hypertension: Secondary | ICD-10-CM | POA: Diagnosis not present

## 2014-06-02 DIAGNOSIS — R739 Hyperglycemia, unspecified: Secondary | ICD-10-CM | POA: Diagnosis not present

## 2014-06-02 DIAGNOSIS — M170 Bilateral primary osteoarthritis of knee: Secondary | ICD-10-CM

## 2014-06-02 DIAGNOSIS — R059 Cough, unspecified: Secondary | ICD-10-CM

## 2014-06-02 MED ORDER — ZOSTER VACCINE LIVE 19400 UNT/0.65ML ~~LOC~~ SOLR
0.6500 mL | Freq: Once | SUBCUTANEOUS | Status: DC
Start: 2014-06-02 — End: 2014-10-02

## 2014-06-02 MED ORDER — ZOSTER VACCINE LIVE 19400 UNT/0.65ML ~~LOC~~ SOLR: 0.6500 mL | Freq: Once | SUBCUTANEOUS | Status: DC

## 2014-06-02 NOTE — Progress Notes (Signed)
Patient ID: Brittney Lang, female   DOB: January 20, 1946, 69 y.o.   MRN: [id number]   Location:  [facility]  Advanced Directive information Does patient have an advance directive?: No, Would patient like information on creating an advanced directive?: Yes - Educational materials given (Patient not sure where previous education material is located )   Allergies  Allergen Reactions  . Sulfur     Unable to recall type pf reaction   . Nickel     Chief Complaint  Patient presents with  . Medical Management of Chronic Issues    4 month follow-up. Patient is fasting for any labs due    HPI: Patient is a 69 y.o. black female seen in the office today for med mgt of chronic diseases.    Nothing really new.    Had abx for rattling in chest.  Still has some at night when sleeping.  Continues to smoke same amount.    Mood doing pretty well.  Is taking her medication.  Klonopin 1mg  daily at bedtime.    Asks blood type--O positive.  Son had kidney transplant and needs a repeat.  Wife previously gave him hers.  She's wondering if she could donate a kidney.  He had juvenile diabetes since 69 yo.  Has been taking her vitamin D supplement.    Myrbetriq continues to help bladder  Is also doing well with bp, but asks if there's a diovan hct in her dosage--unfortunately not (highest 160/12.5).    Says she enjoys smoking.  Nobody home but her and her dog.  Has cut down some.  Doing pretty well walking dog and going up and down steps.  Knees are doing better.    Has not eaten this am so will do labs today.  Again discussed getting zostavax at pharmacy (Rx previously given on two other occasions)--does not really want after arm got so sore from her flu shot last time.  Review of Systems:  Review of Systems  Constitutional: Positive for weight loss. Negative for fever and chills.  HENT: Negative for congestion and hearing loss.   Eyes: Negative for  blurred vision.  Respiratory: Positive for cough. Negative for shortness of breath.   Cardiovascular: Negative for chest pain.  Gastrointestinal: Negative for abdominal pain, blood in stool and melena.  Genitourinary: Positive for urgency.       Improved  Musculoskeletal: Negative for joint pain and falls.       Knee pain improved  Skin: Negative for rash.  Neurological: Negative for dizziness, weakness and headaches.  Psychiatric/Behavioral: Positive for depression. Negative for memory loss. The patient is nervous/anxious.        Improved on medication; admits to some short term memory loss but not notable during visits or with function     Past Medical History  Diagnosis Date  . Hypertension   . Hyperlipemia   . Generalized headaches   . Arthritis   . Depression     Past Surgical History  Procedure Laterality Date  . Abdominal hysterectomy  1980  . Ankle fracture surgery  2011    Left   . Appendectomy  1980    Social History:   reports that she has been smoking Cigarettes.  She has a 15 pack-year smoking history. She does not have any smokeless tobacco history on file. She reports that she drinks alcohol. She reports that she does not use illicit drugs.  Family History  Problem Relation Age of  Onset  . Cancer Mother   . Heart disease Mother   . Hypertension Mother   . Hyperlipidemia Mother   . Diabetes Other     Grand-daughter  . Depression Sister   . Depression Sister     Medications: Patient's Medications  New Prescriptions   No medications on file  Previous Medications   ATORVASTATIN (LIPITOR) 20 MG TABLET    Take one tablet by mouth once daily for cholesterol   CHOLECALCIFEROL (VITAMIN D3) 2000 UNITS CAPSULE    Take 1 capsule (2,000 Units total) by mouth daily.   CLONAZEPAM (KLONOPIN) 1 MG TABLET    Take 1 mg by mouth at bedtime.   FLUOXETINE (PROZAC) 10 MG TABLET    Take 10 mg by mouth daily.   HYDROCHLOROTHIAZIDE (HYDRODIURIL) 12.5 MG TABLET    Take 1  tablet daily for edema   LAMOTRIGINE (LAMICTAL) 25 MG TABLET    Take 25 mg by mouth daily.   MIRABEGRON ER (MYRBETRIQ) 25 MG TB24 TABLET    Take 1 tablet (25 mg total) by mouth daily.   NEBIVOLOL (BYSTOLIC) 5 MG TABLET    Take one tablet by mouth once daily to control blood pressure   VALSARTAN (DIOVAN) 320 MG TABLET    TAKE 1 TABLET BY MOUTH EVERY DAY  Modified Medications   Modified Medication Previous Medication   ZOSTER VACCINE LIVE, PF, (ZOSTAVAX) 54098 UNT/0.65ML INJECTION zoster vaccine live, PF, (ZOSTAVAX) 11914 UNT/0.65ML injection      Inject 19,400 Units into the skin once.    Inject 0.65 mLs into the skin once.  Discontinued Medications   DOXYCYCLINE (VIBRA-TABS) 100 MG TABLET    Take 1 tablet (100 mg total) by mouth 2 (two) times daily.     Physical Exam: Filed Vitals:   06/02/14 0811  BP: 120/82  Pulse: 70  Temp: 97.9 F (36.6 C)  TempSrc: Oral  Resp: 20  Height: 5\' 2"  (1.575 m)  Weight: 178 lb 3.2 oz (80.831 kg)  SpO2: 92%  Physical Exam  Constitutional: She is oriented to person, place, and time. She appears well-developed and well-nourished. No distress.  Cardiovascular: Normal rate, regular rhythm, normal heart sounds and intact distal pulses.   1+ pitting edema at sock line  Pulmonary/Chest: Effort normal and breath sounds normal.  Abdominal: Soft. Bowel sounds are normal.  Musculoskeletal: Normal range of motion.  Neurological: She is alert and oriented to person, place, and time.  Skin: Skin is warm and dry.  Psychiatric: She has a normal mood and affect.   Labs reviewed: Basic Metabolic Panel:  Recent Labs  78/29/56 1218  NA 141  K 3.8  CL 104  CO2 22  GLUCOSE 91  BUN 12  CREATININE 0.83  CALCIUM 9.6   Liver Function Tests:  Recent Labs  02/07/14 1218  AST 14  ALT 16  ALKPHOS 105  BILITOT 0.3  PROT 6.3   CBC:  Recent Labs  02/07/14 1218  WBC 6.5  NEUTROABS 4.2  HGB 14.0  HCT 43.0  MCV 89  PLT 268   Lab Results    Component Value Date   HGBA1C 6.3* 05/24/2013   Had normal mammo Had normal stress myoview with normal EF  Assessment/Plan 1. Essential hypertension, benign -bp at goal with bystolic, diovan, and low sodium diet and walking for exercise -needs to quit smoking -has lost 8 lbs since last visit  2. Primary osteoarthritis of both knees -has improved since knee injection--able to walk more now  3.  Urge incontinence -cont myrbetriq 25mg  daily which has been effective  4. Bipolar 2 disorder -cont klonopin, prozac, lamictal as per her psychiatrist--seems to be doing much better with this regimen--is functioning, exercising and much happier  5. Hyperlipemia - lipitor 20mg  continued--check fasting lab today - Lipid Panel  6. Hyperglycemia -cont walking for exercise, dietary changes with low carb, low fat, low sodium diet  7. Essential hypertension -bp at goal these last two visits, cont current regimen and low sodium diet - Basic Metabolic Panel  8.  Cough -previously had chest pain--stress myoview was negative -she continues to smoke and has some chest congestion that is residual -she is not ready to quit smoking though she was counseled again on this today--advised this would be the best way to get rid of cough and sputum production  -will consider f/u CT (screening for nodule) at next visit in 4 mos  Labs/tests ordered: get bmp and flp that were future orders today  Next appt:  4 mos, will check labs day of visit (comes on bus so hard for her to get here separate time)  Jakobee Brackins L. Kian Gamarra, D.O. Geriatrics [facility] [address], [place] [address] Cell Phone (Mon-Fri 8am-5pm):  [phone] On Call:  [phone] & follow prompts after 5pm & weekends Office Phone:  [phone] Office Fax:  [phone]

## 2014-06-03 ENCOUNTER — Telehealth: Payer: Self-pay

## 2014-06-03 LAB — LIPID PANEL
Chol/HDL Ratio: 4.3 ratio units (ref 0.0–4.4)
Cholesterol, Total: 188 mg/dL (ref 100–199)
HDL: 44 mg/dL (ref >39)
LDL Calculated: 105 mg/dL — ABNORMAL HIGH (ref 0–99)
Triglycerides: 194 mg/dL — ABNORMAL HIGH (ref 0–149)
VLDL Cholesterol Cal: 39 mg/dL (ref 5–40)

## 2014-06-03 LAB — BASIC METABOLIC PANEL
BUN/Creatinine Ratio: 18 (ref 11–26)
BUN: 12 mg/dL (ref 8–27)
CO2: 24 mmol/L (ref 18–29)
Calcium: 10.1 mg/dL (ref 8.7–10.3)
Chloride: 101 mmol/L (ref 97–108)
Creatinine, Ser: 0.68 mg/dL (ref 0.57–1.00)
GFR calc Af Amer: 105 mL/min/{1.73_m2} (ref >59)
GFR calc non Af Amer: 91 mL/min/{1.73_m2} (ref >59)
Glucose: 87 mg/dL (ref 65–99)
Potassium: 3.6 mmol/L (ref 3.5–5.2)
Sodium: 141 mmol/L (ref 134–144)

## 2014-06-03 MED ORDER — ATORVASTATIN CALCIUM 40 MG PO TABS: 40.0000 mg | ORAL_TABLET | Freq: Every day | ORAL | Status: DC

## 2014-06-03 NOTE — Telephone Encounter (Signed)
Discussed with patient, patient verbalized understanding of results. RX for increased dose sent to pharmacy. Copy of report mailed

## 2014-06-03 NOTE — Telephone Encounter (Signed)
-----   Message from Gayland Curry, DO sent at 06/03/2014 10:13 AM EDT ----- Bad cholesterol has improved.  Triglycerides (starchy fats) have gone up a little.  Let's increase her lipitor to 40mg  daily to get her cholesterol to goal.

## 2014-06-12 ENCOUNTER — Encounter: Payer: Self-pay | Admitting: Internal Medicine

## 2014-06-23 ENCOUNTER — Other Ambulatory Visit: Payer: Self-pay | Admitting: Internal Medicine

## 2014-06-26 ENCOUNTER — Other Ambulatory Visit: Payer: Self-pay | Admitting: Internal Medicine

## 2014-09-12 NOTE — Addendum Note (Signed)
Addended by: Alvina Filbert B on: 09/12/2014 01:36 PM   Modules accepted: Orders

## 2014-09-27 ENCOUNTER — Other Ambulatory Visit: Payer: Self-pay | Admitting: Nurse Practitioner

## 2014-09-27 ENCOUNTER — Other Ambulatory Visit: Payer: Self-pay | Admitting: Internal Medicine

## 2014-10-02 ENCOUNTER — Ambulatory Visit (INDEPENDENT_AMBULATORY_CARE_PROVIDER_SITE_OTHER): Payer: Medicare Other | Admitting: Internal Medicine

## 2014-10-02 ENCOUNTER — Encounter: Payer: Self-pay | Admitting: Internal Medicine

## 2014-10-02 VITALS — BP 130/86 | HR 75 | Temp 98.1°F | Ht 62.0 in | Wt 172.0 lb

## 2014-10-02 DIAGNOSIS — M542 Cervicalgia: Secondary | ICD-10-CM | POA: Diagnosis not present

## 2014-10-02 DIAGNOSIS — R739 Hyperglycemia, unspecified: Secondary | ICD-10-CM

## 2014-10-02 DIAGNOSIS — Z720 Tobacco use: Secondary | ICD-10-CM

## 2014-10-02 DIAGNOSIS — Z1211 Encounter for screening for malignant neoplasm of colon: Secondary | ICD-10-CM

## 2014-10-02 DIAGNOSIS — N3941 Urge incontinence: Secondary | ICD-10-CM

## 2014-10-02 DIAGNOSIS — M170 Bilateral primary osteoarthritis of knee: Secondary | ICD-10-CM

## 2014-10-02 DIAGNOSIS — I10 Essential (primary) hypertension: Secondary | ICD-10-CM | POA: Diagnosis not present

## 2014-10-02 DIAGNOSIS — H269 Unspecified cataract: Secondary | ICD-10-CM | POA: Diagnosis not present

## 2014-10-02 DIAGNOSIS — E785 Hyperlipidemia, unspecified: Secondary | ICD-10-CM

## 2014-10-02 DIAGNOSIS — F3181 Bipolar II disorder: Secondary | ICD-10-CM

## 2014-10-02 MED ORDER — BUPROPION HCL ER (XL) 300 MG PO TB24: 300.0000 mg | ORAL_TABLET | Freq: Every day | ORAL | Status: DC

## 2014-10-02 MED ORDER — FLUOXETINE HCL 10 MG PO TABS: 10.0000 mg | ORAL_TABLET | Freq: Every day | ORAL | Status: DC

## 2014-10-02 MED ORDER — CLONAZEPAM 1 MG PO TABS: 1.0000 mg | ORAL_TABLET | Freq: Every day | ORAL | Status: DC

## 2014-10-02 MED ORDER — LAMOTRIGINE 25 MG PO TABS: 25.0000 mg | ORAL_TABLET | Freq: Every day | ORAL | Status: DC

## 2014-10-02 NOTE — Patient Instructions (Signed)
Please check with your insurance about seeing a new psychiatrist.

## 2014-10-02 NOTE — Progress Notes (Signed)
Patient ID: Brittney Lang, female   DOB: 1945-04-16, 69 y.o.   MRN: [id number]   Location:  [facility]  Goals of Care: Advanced Directive information Does patient have an advance directive?: No, Would patient like information on creating an advanced directive?: Yes - Educational materials given (has not yet completed)   Chief Complaint  Patient presents with  . Medical Management of Chronic Issues    4 month follow-up HTN, HDL, and medications  . Medication Management    Patient is looking for a new psych doctor, patient is out of prozac and clonazepam-? if Dr.Ioma Chismar will fill for now     HPI: Patient is a 69 y.o. black female seen in the office today for medical mgt of her chronic diseases.    Bipolar:  Is going to switch psychiatrists so needs a new one.   Asks if we can refill her meds until she establishes with a new one.  Her brother is driving her nuts--he is visiting and was drinking.    On lamictal, wellbutrin, clonazepam and prozac.  Has stress from son having kidney and pancreatic transplant.    BP satisfactory.  Continues on diovan, bystolic, hctz.  No lightheadedness and dizziness.    Still getting pain in back of her head behind left ear.  We had discussed last time that this may be arthritis from her neck worsened by tension.    Hyperlipidemia:  On lipitor.  Denies myalgias.  Bladder doing well with myrbetriq.  Getting up 1-2x at night.  Doesn't sleep real well.  Lives in [place] and sometimes goes to bed early around 5-6pm, then gets up after a bit and does some things and then goes back to sleep.    Left knee doing pretty well, but still feels like it will want to go out occasionally.  Review of Systems:  Review of Systems  Constitutional: Negative for fever and chills.  HENT: Negative for congestion and hearing loss.   Eyes: Positive for blurred vision.  Respiratory: Negative for shortness of breath.     Cardiovascular: Negative for chest pain and leg swelling.  Gastrointestinal: Negative for abdominal pain, constipation, blood in stool and melena.  Genitourinary: Positive for urgency and frequency. Negative for dysuria.  Musculoskeletal: Positive for joint pain and neck pain. Negative for falls.  Skin: Negative for rash.  Neurological: Positive for headaches. Negative for dizziness and loss of consciousness.  Endo/Heme/Allergies: Does not bruise/bleed easily.  Psychiatric/Behavioral: Positive for depression. Negative for suicidal ideas, memory loss and substance abuse. The patient has insomnia.        Bipolar    Past Medical History  Diagnosis Date  . Hypertension   . Hyperlipemia   . Generalized headaches   . Arthritis   . Depression     Past Surgical History  Procedure Laterality Date  . Abdominal hysterectomy  1980  . Ankle fracture surgery  2011    Left   . Appendectomy  1980    Allergies  Allergen Reactions  . Sulfur     Unable to recall type pf reaction   . Nickel    Medications: Patient's Medications  New Prescriptions   No medications on file  Previous Medications   ATORVASTATIN (LIPITOR) 40 MG TABLET    Take 1 tablet (40 mg total) by mouth daily. For Cholesterol   BYSTOLIC 5 MG TABLET    TAKE 1 TABLET BY MOUTH EVERY DAY FOR BLOOD PRESSURE   CHOLECALCIFEROL (  VITAMIN D3) 2000 UNITS CAPSULE    Take 1 capsule (2,000 Units total) by mouth daily.   CLONAZEPAM (KLONOPIN) 1 MG TABLET    Take 1 mg by mouth at bedtime.   FLUOXETINE (PROZAC) 10 MG TABLET    Take 10 mg by mouth daily.   HYDROCHLOROTHIAZIDE (HYDRODIURIL) 12.5 MG TABLET    TAKE 1 TABLET BY MOUTH DAILY FOR EDEMA   LAMOTRIGINE (LAMICTAL) 25 MG TABLET    Take 25 mg by mouth daily.   MIRABEGRON ER (MYRBETRIQ) 25 MG TB24 TABLET    Take 1 tablet (25 mg total) by mouth daily.   VALSARTAN (DIOVAN) 320 MG TABLET    TAKE 1 TABLET BY MOUTH EVERY DAY  Modified Medications   No medications on file  Discontinued  Medications   ZOSTER VACCINE LIVE, PF, (ZOSTAVAX) 16109 UNT/0.65ML INJECTION    Inject 19,400 Units into the skin once.    Physical Exam: Filed Vitals:   10/02/14 0815  BP: 130/86  Pulse: 75  Temp: 98.1 F (36.7 C)  TempSrc: Oral  Height: 5\' 2"  (1.575 m)  Weight: 172 lb (78.019 kg)  SpO2: 94%   Physical Exam  Constitutional: She is oriented to person, place, and time. She appears well-developed and well-nourished. No distress.  Cardiovascular: Normal rate, regular rhythm, normal heart sounds and intact distal pulses.   Pulmonary/Chest: Effort normal and breath sounds normal. No respiratory distress.  Abdominal: Soft. Bowel sounds are normal. She exhibits no distension. There is no tenderness.  Abdominal obesity  Musculoskeletal: She exhibits tenderness.  Left knee and does walk with slight limp  Neurological: She is alert and oriented to person, place, and time.  Skin: Skin is warm and dry.  Psychiatric: She has a normal mood and affect.    Labs reviewed: Basic Metabolic Panel:  Recent Labs  60/45/40 1218 06/02/14 0914  NA 141 141  K 3.8 3.6  CL 104 101  CO2 22 24  GLUCOSE 91 87  BUN 12 12  CREATININE 0.83 0.68  CALCIUM 9.6 10.1   Liver Function Tests:  Recent Labs  02/07/14 1218  AST 14  ALT 16  ALKPHOS 105  BILITOT 0.3  PROT 6.3   No results for input(s): LIPASE, AMYLASE in the last 8760 hours. No results for input(s): AMMONIA in the last 8760 hours. CBC:  Recent Labs  02/07/14 1218  WBC 6.5  NEUTROABS 4.2  HGB 14.0  HCT 43.0  MCV 89  PLT 268   Lipid Panel:  Recent Labs  06/02/14 0914  CHOL 188  HDL 44  LDLCALC 105*  TRIG 194*  CHOLHDL 4.3   Lab Results  Component Value Date   HGBA1C 6.3* 05/24/2013   Assessment/Plan 1. 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 Hyperlipemia -last LDL was 105 in April, almost at goal -  Lipid panel  3. Hyperglycemia -previous elevated fasting glucose and hba1c of 6.3 -diet and exercise encouraged  -reassess labs: - Hemoglobin A1c - Basic metabolic panel  4. Urge incontinence -improved with myrbetriq but does still have some frequency -discussed avoiding fluids 2 hours before bed and caffeinated beverages from supper onward   5. Bipolar 2 disorder -meds were filled today and list of psychiatry offices was given to her today b/c I cannot manage this indefinitely due to multiple meds and simply not being a psychiatrist  6. Essential hypertension, benign -bp well controlled with  low sodium diet, exercise and hctz, diovan--continue these - Basic metabolic panel  7. Primary osteoarthritis of both knees -left doing better right now she notes, cont prn tylenol and may use topical otc agents  8. Colon cancer screening -cologuard info given and form completed--await results when she does test  9. Cataracts, bilateral - needs intervention - Ambulatory referral to Ophthalmology  10. Tobacco abuse -ongoing -cessation encouraged -I don't think she is a good chantix candidate with her bipolar disorder -is already on wellbutrin and has not quit -trying to cut down   Labs/tests ordered: Orders Placed This Encounter  Procedures  . DG Cervical Spine Complete    Standing Status: Future     Number of Occurrences:      Standing Expiration Date: 12/02/2015    Order Specific Question:  Reason for Exam (SYMPTOM  OR DIAGNOSIS REQUIRED)    Answer:  left neck pain intermittent    Order Specific Question:  Preferred imaging location?    Answer:  [facility]  . Hemoglobin A1c  . Lipid panel    Order Specific Question:  Has the patient fasted?    Answer:  Yes  . Basic metabolic panel    Order Specific Question:  Has the patient fasted?    Answer:  Yes  . Ambulatory referral to Ophthalmology    Referral Priority:  Routine    Referral Type:  Consultation     Referral Reason:  Specialty Services Required    Requested Specialty:  Ophthalmology    Number of Visits Requested:  1  COLOGUARD ordered  Next appt:  4 MOS.  For annual exam  Bellarae Lizer L. Rasheeda Mulvehill, D.O. Geriatrics [facility] [address], [place] [address] Cell Phone (Mon-Fri 8am-5pm):  [phone] On Call:  [phone] & follow prompts after 5pm & weekends Office Phone:  [phone] Office Fax:  [phone]

## 2014-10-03 LAB — BASIC METABOLIC PANEL
BUN/Creatinine Ratio: 24 (ref 11–26)
BUN: 16 mg/dL (ref 8–27)
CO2: 18 mmol/L (ref 18–29)
Calcium: 9.5 mg/dL (ref 8.7–10.3)
Chloride: 107 mmol/L (ref 97–108)
Creatinine, Ser: 0.67 mg/dL (ref 0.57–1.00)
GFR calc Af Amer: 105 mL/min/{1.73_m2} (ref >59)
GFR calc non Af Amer: 91 mL/min/{1.73_m2} (ref >59)
Glucose: 109 mg/dL — ABNORMAL HIGH (ref 65–99)
Potassium: 4.1 mmol/L (ref 3.5–5.2)
Sodium: 145 mmol/L — ABNORMAL HIGH (ref 134–144)

## 2014-10-03 LAB — LIPID PANEL
Chol/HDL Ratio: 4.4 ratio units (ref 0.0–4.4)
Cholesterol, Total: 162 mg/dL (ref 100–199)
HDL: 37 mg/dL — ABNORMAL LOW (ref >39)
LDL Calculated: 88 mg/dL (ref 0–99)
Triglycerides: 185 mg/dL — ABNORMAL HIGH (ref 0–149)
VLDL Cholesterol Cal: 37 mg/dL (ref 5–40)

## 2014-10-03 LAB — HEMOGLOBIN A1C
Est. average glucose Bld gHb Est-mCnc: 137 mg/dL
Hgb A1c MFr Bld: 6.4 % — ABNORMAL HIGH (ref 4.8–5.6)

## 2014-10-06 ENCOUNTER — Telehealth: Payer: Self-pay

## 2014-10-06 NOTE — Telephone Encounter (Signed)
[employer] [phone] for prior authorization on Fluoxetine. When asked if patient had tried and fell other medications. Explain that we had written refill for the patient that she normally gets this from her psychiatrists. Case # [id number], send to review board, will let us know in a few days.

## 2014-10-07 NOTE — Telephone Encounter (Signed)
Received fax from [place] and Fluoxetine Approved through 02/28/2015 under Medicare Part D

## 2014-10-18 DIAGNOSIS — Z1212 Encounter for screening for malignant neoplasm of rectum: Secondary | ICD-10-CM | POA: Diagnosis not present

## 2014-10-18 DIAGNOSIS — Z1211 Encounter for screening for malignant neoplasm of colon: Secondary | ICD-10-CM | POA: Diagnosis not present

## 2014-10-18 LAB — FECAL OCCULT BLOOD, GUAIAC: Fecal Occult Blood: NEGATIVE

## 2014-10-18 LAB — COLOGUARD: Cologuard: NEGATIVE

## 2014-10-28 ENCOUNTER — Encounter: Payer: Self-pay | Admitting: *Deleted

## 2014-10-30 ENCOUNTER — Other Ambulatory Visit: Payer: Self-pay | Admitting: Internal Medicine

## 2014-10-30 ENCOUNTER — Telehealth: Payer: Self-pay | Admitting: *Deleted

## 2014-10-30 MED ORDER — VALSARTAN 320 MG PO TABS: ORAL_TABLET | ORAL | Status: DC

## 2014-10-30 NOTE — Telephone Encounter (Signed)
Cologuard results from 10/21/2014 came back Negative and Dr. Mariea Clonts wanted me to call patient with the good news and to let her know we will repeat in 3 years. Patient notified and very thankful. Also asked to have refill called in for her Diovan. Refilled fax.

## 2014-11-06 ENCOUNTER — Ambulatory Visit
Admission: RE | Admit: 2014-11-06 | Discharge: 2014-11-06 | Disposition: A | Discharge status: Home / Self Care | Payer: Medicare Other | Source: Ambulatory Visit | Attending: Internal Medicine | Admitting: Internal Medicine

## 2014-11-06 DIAGNOSIS — M542 Cervicalgia: Secondary | ICD-10-CM | POA: Diagnosis not present

## 2014-11-10 ENCOUNTER — Other Ambulatory Visit: Payer: Self-pay

## 2014-11-10 DIAGNOSIS — M542 Cervicalgia: Secondary | ICD-10-CM

## 2014-11-10 DIAGNOSIS — R52 Pain, unspecified: Secondary | ICD-10-CM

## 2014-11-25 ENCOUNTER — Ambulatory Visit: Payer: Medicare Other | Admitting: Physical Therapy

## 2014-11-30 ENCOUNTER — Other Ambulatory Visit: Payer: Self-pay | Admitting: Internal Medicine

## 2015-01-29 ENCOUNTER — Other Ambulatory Visit: Payer: Self-pay | Admitting: Internal Medicine

## 2015-01-30 ENCOUNTER — Emergency Department (HOSPITAL_COMMUNITY)
Admission: EM | Admit: 2015-01-30 | Discharge: 2015-01-30 | Disposition: A | Discharge status: Home / Self Care | Payer: Medicare Other | Attending: Emergency Medicine | Admitting: Emergency Medicine

## 2015-01-30 ENCOUNTER — Encounter (HOSPITAL_COMMUNITY): Payer: Self-pay

## 2015-01-30 DIAGNOSIS — I10 Essential (primary) hypertension: Secondary | ICD-10-CM | POA: Diagnosis not present

## 2015-01-30 DIAGNOSIS — F329 Major depressive disorder, single episode, unspecified: Secondary | ICD-10-CM | POA: Diagnosis not present

## 2015-01-30 DIAGNOSIS — M1990 Unspecified osteoarthritis, unspecified site: Secondary | ICD-10-CM | POA: Diagnosis not present

## 2015-01-30 DIAGNOSIS — Z79899 Other long term (current) drug therapy: Secondary | ICD-10-CM | POA: Diagnosis not present

## 2015-01-30 DIAGNOSIS — F17210 Nicotine dependence, cigarettes, uncomplicated: Secondary | ICD-10-CM | POA: Diagnosis not present

## 2015-01-30 DIAGNOSIS — E785 Hyperlipidemia, unspecified: Secondary | ICD-10-CM | POA: Diagnosis not present

## 2015-01-30 DIAGNOSIS — R21 Rash and other nonspecific skin eruption: Secondary | ICD-10-CM | POA: Diagnosis not present

## 2015-01-30 MED ORDER — HYDROCORTISONE 2.5 % EX CREA: TOPICAL_CREAM | Freq: Two times a day (BID) | CUTANEOUS | Status: DC

## 2015-01-30 MED ORDER — PREDNISONE 20 MG PO TABS: ORAL_TABLET | ORAL | Status: DC

## 2015-01-30 MED ORDER — CEPHALEXIN 500 MG PO CAPS: 500.0000 mg | ORAL_CAPSULE | Freq: Four times a day (QID) | ORAL | Status: DC

## 2015-01-30 NOTE — ED Notes (Signed)
Pt presents with 1 week h/o red, raised itchy rash.  Pt denies any new topicals, detergents, soaps, medications or foods.  Pt reports rash began to arms, reports blisters and weeping.  Pt denies any yard work, reports h/o contact dermatitis.

## 2015-01-30 NOTE — ED Provider Notes (Signed)
CSN: [id number]     Arrival date & time 01/30/15  [id number] History   First MD Initiated Contact with Patient 01/30/15 1009     No chief complaint on file.    (Consider location/radiation/quality/duration/timing/severity/associated sxs/prior Treatment) HPI  69 year old female presents with a worsening rash over the last 1 week. Patient states she has had a rash like this before that has been attributed to dermatitis. Typically she gets steroids and a cream and feels better. She is worried they're becoming infected because now they are weeping, especially the right arm. The fluid is clear. No fevers. Is localized to her bilateral arms, chest, and upper back. No trouble breathing or swallowing. No trouble speaking. Patient denies any new contacts or allergens.  Past Medical History  Diagnosis Date  . Hypertension   . Hyperlipemia   . Generalized headaches   . Arthritis   . Depression    Past Surgical History  Procedure Laterality Date  . Abdominal hysterectomy  1980  . Ankle fracture surgery  2011    Left   . Appendectomy  1980   Family History  Problem Relation Age of Onset  . Cancer Mother   . Heart disease Mother   . Hypertension Mother   . Hyperlipidemia Mother   . Diabetes Other     Grand-daughter  . Depression Sister   . Depression Sister   . Kidney disease Son     Kidney Transplant   . Pancreatic disease Son     Pancrease Transplant    Social History  Substance Use Topics  . Smoking status: Current Every Day Smoker -- 0.50 packs/day for 30 years    Types: Cigarettes  . Smokeless tobacco: None  . Alcohol Use: Yes     Comment: occasional beer   OB History    No data available     Review of Systems  Constitutional: Negative for fever.  HENT: Negative for trouble swallowing.   Respiratory: Negative for shortness of breath.   Cardiovascular: Negative for chest pain.  Gastrointestinal: Negative for vomiting.  Skin: Positive for rash.  All other systems reviewed  and are negative.     Allergies  Sulfur and Nickel  Home Medications   Prior to Admission medications   Medication Sig Start Date End Date Taking? Authorizing Provider  atorvastatin (LIPITOR) 40 MG tablet TAKE 1 TABLET(40 MG) BY MOUTH DAILY FOR CHOLESTEROL 01/29/15   Tiffany L Reed, DO  buPROPion (WELLBUTRIN XL) 300 MG 24 hr tablet Take 1 tablet (300 mg total) by mouth daily. 10/02/14   Tiffany L Reed, DO  BYSTOLIC 5 MG tablet TAKE 1 TABLET BY MOUTH EVERY DAY FOR BLOOD PRESSURE 09/29/14   Lauree Chandler, NP  cephALEXin (KEFLEX) 500 MG capsule Take 1 capsule (500 mg total) by mouth 4 (four) times daily. 01/30/15   Sherwood Gambler, MD  Cholecalciferol (VITAMIN D3) 2000 UNITS capsule Take 1 capsule (2,000 Units total) by mouth daily. 02/14/13   Tiffany L Reed, DO  clonazePAM (KLONOPIN) 1 MG tablet Take 1 tablet (1 mg total) by mouth at bedtime. 10/02/14   Tiffany L Reed, DO  FLUoxetine (PROZAC) 10 MG tablet Take 1 tablet (10 mg total) by mouth daily. 10/02/14   Tiffany L Reed, DO  hydrochlorothiazide (HYDRODIURIL) 12.5 MG tablet TAKE 1 TABLET BY MOUTH DAILY FOR SWELLING 12/01/14   Tiffany L Reed, DO  hydrocortisone 2.5 % cream Apply topically 2 (two) times daily. 01/30/15   Sherwood Gambler, MD  lamoTRIgine (LAMICTAL) 25 MG tablet  Take 1 tablet (25 mg total) by mouth daily. 10/02/14   Tiffany L Reed, DO  mirabegron ER (MYRBETRIQ) 25 MG TB24 tablet Take 1 tablet (25 mg total) by mouth daily. 01/16/14   Tiffany L Reed, DO  predniSONE (DELTASONE) 20 MG tablet 3 tabs po daily x 3 days, then 2 tabs x 3 days, then 1.5 tabs x 3 days, then 1 tab x 3 days, then 0.5 tabs x 3 days 01/30/15   Sherwood Gambler, MD  valsartan (DIOVAN) 320 MG tablet TAKE 1 TABLET BY MOUTH EVERY DAY 09/29/14   Lauree Chandler, NP  valsartan (DIOVAN) 320 MG tablet Take one tablet by mouth once daily for blood pressure 10/30/14   Tiffany L Reed, DO   BP 177/88 mmHg  Pulse 95  Temp(Src) 98.4 F (36.9 C) (Oral)  Resp 16  Ht 5\' 2"  (1.575 m)   Wt 170 lb (77.111 kg)  BMI 31.09 kg/m2  SpO2 98% Physical Exam  Constitutional: She is oriented to person, place, and time. She appears well-developed and well-nourished.  Normal voice  HENT:  Head: Normocephalic and atraumatic.  Right Ear: External ear normal.  Left Ear: External ear normal.  Nose: Nose normal.  Eyes: Right eye exhibits no discharge. Left eye exhibits no discharge.  Neck: Neck supple.  Cardiovascular: Normal rate, regular rhythm and normal heart sounds.   Pulmonary/Chest: Effort normal and breath sounds normal. No stridor. She has no rales.  Abdominal: Soft. She exhibits no distension. There is no tenderness.  Neurological: She is alert and oriented to person, place, and time.  Skin: Skin is warm and dry. Rash noted.     Very dry skin with erythema and warmth. Right anterior upper arm with peeling, cracking. No drainage. No joint swelling or decreased ROM  Nursing note and vitals reviewed.   ED Course  Procedures (including critical care time) Labs Review Labs Reviewed - No data to display  Imaging Review No results found. I have personally reviewed and evaluated these images and lab results as part of my medical decision-making.   EKG Interpretation None      MDM   Final diagnoses:  Rash and nonspecific skin eruption    Patient with recurrent rash. There are some areas of skin breakdown with erythema that is concerning for early infection. She will be covered with antibiotics. She will also be given systemic steroids given the extent of her itchiness and diffuse rash. This appears to be a recurrent problem. No obvious source. No signs of anaphylaxis. Advised strict return precautions and close follow-up with her PCP.    Sherwood Gambler, MD 01/30/15 [phone]

## 2015-02-06 ENCOUNTER — Encounter: Payer: Self-pay | Admitting: Internal Medicine

## 2015-02-06 ENCOUNTER — Ambulatory Visit (INDEPENDENT_AMBULATORY_CARE_PROVIDER_SITE_OTHER): Payer: Medicare Other | Admitting: Internal Medicine

## 2015-02-06 VITALS — BP 120/84 | HR 73 | Temp 98.1°F | Ht 62.0 in | Wt 176.0 lb

## 2015-02-06 DIAGNOSIS — E785 Hyperlipidemia, unspecified: Secondary | ICD-10-CM

## 2015-02-06 DIAGNOSIS — N3941 Urge incontinence: Secondary | ICD-10-CM

## 2015-02-06 DIAGNOSIS — R739 Hyperglycemia, unspecified: Secondary | ICD-10-CM

## 2015-02-06 DIAGNOSIS — I10 Essential (primary) hypertension: Secondary | ICD-10-CM | POA: Diagnosis not present

## 2015-02-06 DIAGNOSIS — R21 Rash and other nonspecific skin eruption: Secondary | ICD-10-CM

## 2015-02-06 DIAGNOSIS — F3181 Bipolar II disorder: Secondary | ICD-10-CM | POA: Diagnosis not present

## 2015-02-06 DIAGNOSIS — Z23 Encounter for immunization: Secondary | ICD-10-CM | POA: Diagnosis not present

## 2015-02-06 DIAGNOSIS — Z0000 Encounter for general adult medical examination without abnormal findings: Secondary | ICD-10-CM | POA: Diagnosis not present

## 2015-02-06 DIAGNOSIS — T7840XA Allergy, unspecified, initial encounter: Secondary | ICD-10-CM

## 2015-02-06 MED ORDER — ASPIRIN EC 81 MG PO TBEC: 81.0000 mg | DELAYED_RELEASE_TABLET | Freq: Every day | ORAL | Status: AC

## 2015-02-06 MED ORDER — MIRABEGRON ER 25 MG PO TB24: 25.0000 mg | ORAL_TABLET | Freq: Every day | ORAL | Status: DC

## 2015-02-06 NOTE — Patient Instructions (Signed)
  Brittney Lang , Thank you for taking time to come for your Medicare Wellness Visit. I appreciate your ongoing commitment to your health goals. Please review the following plan we discussed and let me know if I can assist you in the future.   These are the goals we discussed: Goals    None     Quit smoking Exercise more Eat more vegetables  This is a list of the screening recommended for you and due dates:  Health Maintenance  Topic Date Due  .  Hepatitis C: One time screening is recommended by Center for Disease Control  (CDC) for  adults born from [birth year] through 1964.   1945/08/25  . Colon Cancer Screening  01/22/1997  . Flu Shot  09/29/2014  . Pneumonia vaccines (2 of 2 - PPSV23) 07/01/2015  . Stool Blood Test  10/21/2015  . Mammogram  02/11/2016  . Tetanus Vaccine  02/29/2020  . DEXA scan (bone density measurement)  Completed  . Shingles Vaccine  Completed  cologuard was done this year and will be done again in 3 years Flu shot done today

## 2015-02-06 NOTE — Progress Notes (Signed)
Patient ID: Brittney Lang, female   DOB: 27-Oct-1945, 69 y.o.   MRN: [id number]   Location: [facility]  Provider: Gwenith Spitz. Renato Gails, D.O., C.M.D.  Goals of Care: Advanced Directive information Does patient have an advance directive?: Yes, Type of Advance Directive: Living will  Chief Complaint  Patient presents with  . Annual Exam    Wellness exam  . Follow-up    went to ED 01/30/15 for rash with erythema, antibiotic and steroid given. Getting better,    HPI: Patient is a 69 y.o. female seen in the office today for an annual wellness exam and ED f/u for 01/30/15.   She had bought talcum powder with corn starch.  She was applying it to the top part of of her body.  It was in arm folds, around neck and was very hard under her chin.   Still on prednisone, hydrocortisone cream and antibiotic. Rash was in all of her upper body skin folds--got to where it was weeping. After it started, she was even using the powder to dry it up.    Depression screen [facility] 2/9 02/06/2015 12/28/2012  Decreased Interest 0 3  Down, Depressed, Hopeless 3 1  PHQ - 2 Score 3 4  Altered sleeping 3 -  Tired, decreased energy 3 -  Change in appetite 0 -  Feeling bad or failure about yourself  0 -  Trouble concentrating 0 -  Moving slowly or fidgety/restless 0 -  Suicidal thoughts 0 -  PHQ-9 Score 9 -  Difficult doing work/chores Not difficult at all -  Depression remains.  Meds have not changed recently.  Does not always have medications on hand.    Fall Risk  02/06/2015 10/02/2014 06/02/2014 02/07/2014  Falls in the past year? No No No No   MMSE - Mini Mental State Exam 02/06/2015  Orientation to time 5  Orientation to Place 5  Registration 3  Attention/ Calculation 1  Attention/Calculation-comments one subtraction correct, patient couldn't spell backwards  Recall 2  Language- name 2 objects 2  Language- repeat 1  Language- follow 3 step command 2  Language- read & follow direction 1  Write a  sentence 1  Copy design 1  Total score 24  concentration and one on recall and one on 3 stage command  Health Maintenance  Topic Date Due  . Hepatitis C Screening  03-14-1945  . COLONOSCOPY  01/22/1997  . INFLUENZA VACCINE  09/29/2014  . PNA vac Low Risk Adult (2 of 2 - PPSV23) 07/01/2015  . COLON CANCER SCREENING ANNUAL FOBT  10/21/2015  . MAMMOGRAM  02/11/2016  . TETANUS/TDAP  02/29/2020  . DEXA SCAN  Completed  . ZOSTAVAX  Completed   Urinary incontinence?  Yes.  Every now and then--it's b/c she has to walk a long distance when she takes the bus.  Urgency.  Also some leakage if coughs or sneezes.  myrbetriq did work when she was taking it--had samples. Was not too expensive and was covered by insurance.    Functional status:  Does all activities of daily living and instrumental activities, as well.  Does not drive only b/c she does not have a car.  Exercise:  [employer]  and walking to store or from bus stops.  Diet:  Admits she is not doing great on her diet.  Tries to fix veggies and meat, but does not always eat the veggies.    Vision:  20/50 right, 20/70 left, and both 20/40.  Needs to go.  Had seen Dr. Nile Riggs and she had to pay $30 up front which she did not have.  Does need to go. Has a cataract on her right eye.  Not bad enough to take off back when she saw Dr. Mitzi Davenport several years ago.  Hearing:  No problem here.  Dentition:  Needs a dentist.  Insurance only covers the cleaning and xrays not for repairs/other dental work.    Needs to go get her mammogram done.  Review of Systems:  Review of Systems  Constitutional: Negative for fever and chills.  HENT: Negative for congestion.   Respiratory: Negative for shortness of breath.   Cardiovascular: Positive for leg swelling. Negative for chest pain and palpitations.  Genitourinary: Positive for urgency and frequency. Negative for dysuria.  Musculoskeletal: Negative for myalgias and falls.  Skin: Positive for itching  and rash.  Neurological: Negative for dizziness and loss of consciousness.  Psychiatric/Behavioral: Positive for depression. Negative for memory loss. The patient is nervous/anxious and has insomnia.     Past Medical History  Diagnosis Date  . Hypertension   . Hyperlipemia   . Generalized headaches   . Arthritis   . Depression     Past Surgical History  Procedure Laterality Date  . Abdominal hysterectomy  1980  . Ankle fracture surgery  2011    Left   . Appendectomy  1980     Allergies  Allergen Reactions  . Latex Rash  . Powders [Starch] Rash    Talcum powder, has corn starch allergy  . Sulfur     Unable to recall type pf reaction   . Nickel       Medication List       This list is accurate as of: 02/06/15  1:51 PM.  Always use your most recent med list.               aspirin EC 81 MG tablet  Take 1 tablet (81 mg total) by mouth daily.     atorvastatin 40 MG tablet  Commonly known as:  LIPITOR  TAKE 1 TABLET(40 MG) BY MOUTH DAILY FOR CHOLESTEROL     buPROPion 300 MG 24 hr tablet  Commonly known as:  WELLBUTRIN XL  Take 1 tablet (300 mg total) by mouth daily.     BYSTOLIC 5 MG tablet  Generic drug:  nebivolol  TAKE 1 TABLET BY MOUTH EVERY DAY FOR BLOOD PRESSURE     cephALEXin 500 MG capsule  Commonly known as:  KEFLEX  Take 1 capsule (500 mg total) by mouth 4 (four) times daily.     clonazePAM 1 MG tablet  Commonly known as:  KLONOPIN  Take 1 tablet (1 mg total) by mouth at bedtime.     FLUoxetine 10 MG tablet  Commonly known as:  PROZAC  Take 1 tablet (10 mg total) by mouth daily.     hydrochlorothiazide 12.5 MG tablet  Commonly known as:  HYDRODIURIL  TAKE 1 TABLET BY MOUTH DAILY FOR SWELLING     hydrocortisone 2.5 % cream  Apply topically 2 (two) times daily.     lamoTRIgine 25 MG tablet  Commonly known as:  LAMICTAL  Take 1 tablet (25 mg total) by mouth daily.     mirabegron ER 25 MG Tb24 tablet  Commonly known as:  MYRBETRIQ  Take  1 tablet (25 mg total) by mouth daily.     predniSONE 20 MG tablet  Commonly known as:  DELTASONE  3 tabs po daily x 3  days, then 2 tabs x 3 days, then 1.5 tabs x 3 days, then 1 tab x 3 days, then 0.5 tabs x 3 days     valsartan 320 MG tablet  Commonly known as:  DIOVAN  Take one tablet by mouth once daily for blood pressure     Vitamin D3 2000 UNITS capsule  Take 1 capsule (2,000 Units total) by mouth daily.        Physical Exam: Filed Vitals:   02/06/15 0918  BP: 120/84  Pulse: 73  Temp: 98.1 F (36.7 C)  TempSrc: Oral  Height: 5\' 2"  (1.575 m)  Weight: 176 lb (79.833 kg)  SpO2: 97%   Body mass index is 32.18 kg/(m^2). Physical Exam  Constitutional: She is oriented to person, place, and time. She appears well-developed and well-nourished. No distress.  HENT:  Head: Normocephalic and atraumatic.  Right Ear: External ear normal.  Left Ear: External ear normal.  Nose: Nose normal.  Mouth/Throat: Oropharynx is clear and moist.  Poor dentition  Eyes: Conjunctivae and EOM are normal. Pupils are equal, round, and reactive to light.  Appears to have cataracts bilaterally  Neck: Normal range of motion. Neck supple.  Cardiovascular: Normal rate, regular rhythm, normal heart sounds and intact distal pulses.   Pulmonary/Chest: Effort normal and breath sounds normal. No respiratory distress. Right breast exhibits no inverted nipple, no mass, no nipple discharge, no skin change and no tenderness. Left breast exhibits no inverted nipple, no mass, no nipple discharge, no skin change and no tenderness.  Abdominal: Soft. Bowel sounds are normal.  Genitourinary:  Has been incontinent of bladder today and pants are wet  Musculoskeletal: Normal range of motion. She exhibits no edema or tenderness.  Neurological: She is alert and oriented to person, place, and time. She has normal reflexes. No cranial nerve deficit.  Skin:  Dry scaly areas of axillary areas, around base of neck, bilateral  arms with erythema, no open areas or drainage now  Psychiatric:  Somewhat flat affect    Labs reviewed: Basic Metabolic Panel:  Recent Labs  62/13/08 1218 06/02/14 0914 10/02/14 0930  NA 141 141 145*  K 3.8 3.6 4.1  CL 104 101 107  CO2 22 24 18   GLUCOSE 91 87 109*  BUN 12 12 16   CREATININE 0.83 0.68 0.67  CALCIUM 9.6 10.1 9.5   Liver Function Tests:  Recent Labs  02/07/14 1218  AST 14  ALT 16  ALKPHOS 105  BILITOT 0.3  PROT 6.3  ALBUMIN 4.0   No results for input(s): LIPASE, AMYLASE in the last 8760 hours. No results for input(s): AMMONIA in the last 8760 hours. CBC:  Recent Labs  02/07/14 1218  WBC 6.5  NEUTROABS 4.2  HGB 14.0  HCT 43.0  MCV 89  PLT 268   Lipid Panel:  Recent Labs  06/02/14 0914 10/02/14 0930  CHOL 188 162  HDL 44 37*  LDLCALC 105* 88  TRIG 194* 185*  CHOLHDL 4.3 4.4   Lab Results  Component Value Date   HGBA1C 6.4* 10/02/2014    Assessment/Plan 1. Medicare annual wellness visit, subsequent -needs to schedule her mammogram -had cologuard which was negative 10/18/14 -flu shot given today -needs pneumovax next april  2. Rash due to allergy -resolving, finishing abx, prednisone and topical steroids -talcum powder allergy added to list  3. Bipolar 2 disorder (HCC) -cont wellbutrin xl, klonopin, prozac, and lamictal for this -f/u with psychiatry  4. Urge incontinence - had an episode this am  and was wet when she arrived - mirabegron ER (MYRBETRIQ) 25 MG TB24 tablet; Take 1 tablet (25 mg total) by mouth daily.  Dispense: 30 tablet; Refill: 3  5. Essential hypertension, benign - bp well controlled with bystolic 5mg  hctz 12.5mg , and ciovan 320mg  daily - start baby asa -aspirin EC 81 MG tablet; Take 1 tablet (81 mg total) by mouth daily.  Dispense: 30 tablet; Refill: 3  6. Hyperlipemia - cont lipitor 40mg  po withe evening meal - aspirin EC 81 MG tablet; Take 1 tablet (81 mg total) by mouth daily.  Dispense: 30 tablet;  Refill: 3  7. Hyperglycemia -counseled in diet and exercise to prevent progression to diabetes or at least delay it - aspirin EC 81 MG tablet; Take 1 tablet (81 mg total) by mouth daily.  Dispense: 30 tablet; Refill: 3  8.  Need for influenza -given today  Labs/tests ordered:   Orders Placed This Encounter  Procedures  . Flu Vaccine QUAD 36+ mos PF IM (Fluarix & Fluzone Quad PF)  . Basic metabolic panel    Order Specific Question:  Has the patient fasted?    Answer:  Yes  . Hemoglobin A1c  . Lipid panel    Order Specific Question:  Has the patient fasted?    Answer:  Yes  . Cologuard    This external order was created through the Results Console.    Next appt:  3 mos for med mgt  Mikeria Valin L. Joseangel Nettleton, D.O. Geriatrics [facility] [address], [place] [address] Cell Phone (Mon-Fri 8am-5pm):  [phone] On Call:  [phone] & follow prompts after 5pm & weekends Office Phone:  [phone] Office Fax:  [phone]

## 2015-02-06 NOTE — Progress Notes (Signed)
Patient ID: Brittney Lang, female   DOB: 04/17/1945, 69 y.o.   MRN: [id number] MMSE 24/30 passed clock drawing

## 2015-02-07 LAB — BASIC METABOLIC PANEL
BUN/Creatinine Ratio: 22 (ref 11–26)
BUN: 17 mg/dL (ref 8–27)
CO2: 23 mmol/L (ref 18–29)
Calcium: 9.8 mg/dL (ref 8.7–10.3)
Chloride: 100 mmol/L (ref 97–106)
Creatinine, Ser: 0.76 mg/dL (ref 0.57–1.00)
GFR calc Af Amer: 93 mL/min/{1.73_m2} (ref >59)
GFR calc non Af Amer: 81 mL/min/{1.73_m2} (ref >59)
Glucose: 98 mg/dL (ref 65–99)
Potassium: 4.1 mmol/L (ref 3.5–5.2)
Sodium: 139 mmol/L (ref 136–144)

## 2015-02-07 LAB — LIPID PANEL
Chol/HDL Ratio: 3.5 ratio units (ref 0.0–4.4)
Cholesterol, Total: 183 mg/dL (ref 100–199)
HDL: 53 mg/dL (ref >39)
LDL Calculated: 83 mg/dL (ref 0–99)
Triglycerides: 234 mg/dL — ABNORMAL HIGH (ref 0–149)
VLDL Cholesterol Cal: 47 mg/dL — ABNORMAL HIGH (ref 5–40)

## 2015-02-07 LAB — HEMOGLOBIN A1C
Est. average glucose Bld gHb Est-mCnc: 126 mg/dL
Hgb A1c MFr Bld: 6 % — ABNORMAL HIGH (ref 4.8–5.6)

## 2015-02-09 ENCOUNTER — Telehealth: Payer: Self-pay

## 2015-02-09 DIAGNOSIS — E785 Hyperlipidemia, unspecified: Secondary | ICD-10-CM

## 2015-02-09 MED ORDER — ATORVASTATIN CALCIUM 80 MG PO TABS: 80.0000 mg | ORAL_TABLET | Freq: Every day | ORAL | Status: DC

## 2015-02-09 NOTE — Telephone Encounter (Signed)
-----   Message from Gayland Curry, DO sent at 02/07/2015 10:28 AM EST ----- Sugar average has gotten much better.  Down to 6 from 6.4 meaning her sugar, on average, has been running around 126 which is fabulous.   Electrolytes and kidneys are normal. Triglycerides have trended significantly upward, however.  Let's increase her lipitor to 80mg  from 40mg .  Please send in new Rx. If she has just gotten her refills, she can take two of the 40mg  until she uses them up.

## 2015-02-09 NOTE — Telephone Encounter (Signed)
Discussed with patient, patient verbalized understanding of results. New rx sent to [facility] for Lipitor 80 mg. Copy of labs mailed.

## 2015-02-17 ENCOUNTER — Ambulatory Visit (INDEPENDENT_AMBULATORY_CARE_PROVIDER_SITE_OTHER): Payer: Medicare Other | Admitting: Nurse Practitioner

## 2015-02-17 ENCOUNTER — Encounter: Payer: Self-pay | Admitting: Nurse Practitioner

## 2015-02-17 VITALS — BP 120/82 | HR 82 | Temp 97.6°F | Resp 20 | Ht 62.0 in | Wt 175.4 lb

## 2015-02-17 DIAGNOSIS — R21 Rash and other nonspecific skin eruption: Secondary | ICD-10-CM

## 2015-02-17 MED ORDER — CLOTRIMAZOLE 1 % EX CREA: 1.0000 "application " | TOPICAL_CREAM | Freq: Two times a day (BID) | CUTANEOUS | Status: DC

## 2015-02-17 MED ORDER — HYDROCORTISONE 2.5 % EX CREA: TOPICAL_CREAM | Freq: Two times a day (BID) | CUTANEOUS | Status: DC

## 2015-02-17 MED ORDER — METHYLPREDNISOLONE ACETATE 40 MG/ML IJ SUSP
40.0000 mg | Freq: Once | INTRAMUSCULAR | Status: AC
  Administered 2015-02-17: 40 mg via INTRAMUSCULAR

## 2015-02-17 NOTE — Progress Notes (Signed)
Patient ID: Brittney Lang, female   DOB: 1945-03-15, 69 y.o.   MRN: [id number]    PCP: Hollace Kinnier, DO  Advanced Directive information Does patient have an advance directive?: No, Would patient like information on creating an advanced directive?: Yes - Educational materials given  Allergies  Allergen Reactions  . Latex Rash  . Powders [Starch] Rash    Talcum powder, has corn starch allergy  . Sulfur     Unable to recall type pf reaction   . Nickel     Chief Complaint  Patient presents with  . Acute Visit    Contact Dermatitis started last week on wesdnday with iching turned into weaping a few days ago     HPI: Patient is a 69 y.o. female seen in the office today due to rash that started 6 days ago. Burning and welping from chest area. No new lotions, detergents. Burning to chest and upper arms. Increase itch. December 2nd she broke out due to talcum powder around her neck (similar symptoms). Went to ED and was placed on antibiotic, steroid PO and topical and rash resolved. Completed treatment on the 9th, and now symptoms have recurred started on the 14th. No shortness of breath, trouble swallowing. No swelling to neck or lip area.  Review of Systems:  Review of Systems  Constitutional: Negative for fever and chills.  HENT: Negative for congestion.   Respiratory: Negative for cough, choking, shortness of breath and wheezing.   Cardiovascular: Negative for chest pain and palpitations.  Genitourinary: Negative for dysuria.  Musculoskeletal: Negative for myalgias.  Skin: Positive for rash (to arms and chest area with weeping ).  Neurological: Negative for dizziness.    Past Medical History  Diagnosis Date  . Hypertension   . Hyperlipemia   . Generalized headaches   . Arthritis   . Depression    Past Surgical History  Procedure Laterality Date  . Abdominal hysterectomy  1980  . Ankle fracture surgery  2011    Left   . Appendectomy  1980   Social History:  reports that she has been smoking Cigarettes.  She has a 15 pack-year smoking history. She has never used smokeless tobacco. She reports that she drinks alcohol. She reports that she does not use illicit drugs.  Family History  Problem Relation Age of Onset  . Cancer Mother   . Heart disease Mother   . Hypertension Mother   . Hyperlipidemia Mother   . Diabetes Other     Grand-daughter  . Depression Sister   . Depression Sister   . Kidney disease Son     Kidney Transplant   . Pancreatic disease Son     Pancrease Transplant     Medications: Patient's Medications  New Prescriptions   No medications on file  Previous Medications   ASPIRIN EC 81 MG TABLET    Take 1 tablet (81 mg total) by mouth daily.   ATORVASTATIN (LIPITOR) 80 MG TABLET    Take 1 tablet (80 mg total) by mouth daily. For cholesterol   BUPROPION (WELLBUTRIN XL) 300 MG 24 HR TABLET    Take 1 tablet (300 mg total) by mouth daily.   BYSTOLIC 5 MG TABLET    TAKE 1 TABLET BY MOUTH EVERY DAY FOR BLOOD PRESSURE   CHOLECALCIFEROL (VITAMIN D3) 2000 UNITS CAPSULE    Take 1 capsule (2,000 Units total) by mouth daily.   CLONAZEPAM (KLONOPIN) 1 MG TABLET    Take 1 tablet (1 mg total) by  mouth at bedtime.   FLUOXETINE (PROZAC) 10 MG TABLET    Take 1 tablet (10 mg total) by mouth daily.   HYDROCHLOROTHIAZIDE (HYDRODIURIL) 12.5 MG TABLET    TAKE 1 TABLET BY MOUTH DAILY FOR SWELLING   HYDROCORTISONE 2.5 % CREAM    Apply topically 2 (two) times daily.   LAMOTRIGINE (LAMICTAL) 25 MG TABLET    Take 1 tablet (25 mg total) by mouth daily.   MIRABEGRON ER (MYRBETRIQ) 25 MG TB24 TABLET    Take 1 tablet (25 mg total) by mouth daily.   VALSARTAN (DIOVAN) 320 MG TABLET    Take one tablet by mouth once daily for blood pressure  Modified Medications   No medications on file  Discontinued Medications   CEPHALEXIN (KEFLEX) 500 MG CAPSULE    Take 1 capsule (500 mg total) by mouth 4 (four) times daily.   PREDNISONE (DELTASONE) 20 MG TABLET    3  tabs po daily x 3 days, then 2 tabs x 3 days, then 1.5 tabs x 3 days, then 1 tab x 3 days, then 0.5 tabs x 3 days     Physical Exam:  Filed Vitals:   02/17/15 1202  BP: 120/82  Pulse: 82  Temp: 97.6 F (36.4 C)  TempSrc: Oral  Resp: 20  Height: 5\' 2"  (1.575 m)  Weight: 175 lb 6.4 oz (79.561 kg)  SpO2: 95%   Body mass index is 32.07 kg/(m^2).  Physical Exam  Constitutional: She is oriented to person, place, and time. She appears well-developed and well-nourished. No distress.  HENT:  Head: Normocephalic and atraumatic.  Mouth/Throat: Oropharynx is clear and moist. No oropharyngeal exudate.  Eyes: Conjunctivae and EOM are normal. Pupils are equal, round, and reactive to light.  Neck: Normal range of motion. Neck supple.  Cardiovascular: Normal rate, regular rhythm and normal heart sounds.   Pulmonary/Chest: Effort normal and breath sounds normal. No respiratory distress. She has no wheezes.  Lymphadenopathy:    She has no cervical adenopathy.  Neurological: She is alert and oriented to person, place, and time.  Skin: Skin is warm and dry. Rash noted. She is not diaphoretic. There is erythema.  Raised rash to antecubital to  bilateral arms with erythema and clear drainage   Psychiatric: She has a normal mood and affect.    Labs reviewed: Basic Metabolic Panel:  Recent Labs  06/02/14 0914 10/02/14 0930 02/06/15 1009  NA 141 145* 139  K 3.6 4.1 4.1  CL 101 107 100  CO2 24 18 23   GLUCOSE 87 109* 98  BUN 12 16 17   CREATININE 0.68 0.67 0.76  CALCIUM 10.1 9.5 9.8   Liver Function Tests: No results for input(s): AST, ALT, ALKPHOS, BILITOT, PROT, ALBUMIN in the last 8760 hours. No results for input(s): LIPASE, AMYLASE in the last 8760 hours. No results for input(s): AMMONIA in the last 8760 hours. CBC: No results for input(s): WBC, NEUTROABS, HGB, HCT, MCV, PLT in the last 8760 hours. Lipid Panel:  Recent Labs  06/02/14 0914 10/02/14 0930 02/06/15 1009  CHOL  188 162 183  HDL 44 37* 53  LDLCALC 105* 88 83  TRIG 194* 185* 234*  CHOLHDL 4.3 4.4 3.5   TSH: No results for input(s): TSH in the last 8760 hours. A1C: Lab Results  Component Value Date   HGBA1C 6.0* 02/06/2015     Assessment/Plan 1. Rash and nonspecific skin eruption -pt with increase sensitives however appears to have fungal component, has already been treated with prednisone  taper and keflex.  - clotrimazole (LOTRIMIN) 1 % cream; Apply 1 application topically 2 (two) times daily.  Dispense: 30 g; Refill: 0 - hydrocortisone 2.5 % cream; Apply topically 2 (two) times daily.  Dispense: 20 g; Refill: 0 - methylPREDNISolone acetate (DEPO-MEDROL) injection 40 mg; Inject 1 mL (40 mg total) into the muscle once.  To follow up in office in 1 week, call sooner if no improvement noted  Discussed when to seek emergency intervention.   Carlos American. Harle Battiest  [facility] [phone] 8 am - 5 pm) [phone] (after hours)

## 2015-02-17 NOTE — Patient Instructions (Signed)
Injection given in office of steroid To use creams twice daily for 1 week and then follow up

## 2015-02-26 ENCOUNTER — Telehealth: Payer: Self-pay

## 2015-02-26 ENCOUNTER — Ambulatory Visit (INDEPENDENT_AMBULATORY_CARE_PROVIDER_SITE_OTHER): Payer: Medicare Other | Admitting: Nurse Practitioner

## 2015-02-26 ENCOUNTER — Encounter: Payer: Self-pay | Admitting: Nurse Practitioner

## 2015-02-26 VITALS — BP 124/70 | HR 66 | Temp 97.9°F | Ht 62.0 in | Wt 179.0 lb

## 2015-02-26 DIAGNOSIS — R21 Rash and other nonspecific skin eruption: Secondary | ICD-10-CM

## 2015-02-26 DIAGNOSIS — L03319 Cellulitis of trunk, unspecified: Secondary | ICD-10-CM

## 2015-02-26 DIAGNOSIS — R220 Localized swelling, mass and lump, head: Secondary | ICD-10-CM

## 2015-02-26 MED ORDER — CEPHALEXIN 500 MG PO CAPS: 500.0000 mg | ORAL_CAPSULE | Freq: Four times a day (QID) | ORAL | Status: DC

## 2015-02-26 MED ORDER — FLUCONAZOLE 150 MG PO TABS: ORAL_TABLET | ORAL | Status: DC

## 2015-02-26 NOTE — Telephone Encounter (Signed)
Spoke with patient, per Janett Billow patient can take Claritin 10 mg daily and benadryl 25 mg every 8 hours as needed for flare up. Patient verbalized understanding

## 2015-02-26 NOTE — Patient Instructions (Signed)
To take keflex 500 mg 4 times daily for 7 days Take diflucan 150 mg today and repeat in 3 days  To use OTC clotrimazole cream twice daily-- ask pharmacist for cream for athletes foot and place on affected area  Will refer to dermatology

## 2015-02-26 NOTE — Progress Notes (Signed)
Patient ID: Brittney Lang, female   DOB: [birth year], 69 y.o.   MRN: [id number]    PCP: Hollace Kinnier, DO   Allergies  Allergen Reactions  . Latex Rash  . Powders [Starch] Rash    Talcum powder, has corn starch allergy  . Sulfur     Unable to recall type pf reaction   . Nickel     Chief Complaint  Patient presents with  . Acute Visit    Patient c/o ongoing skin concerns. Skin is not healing, left breast has increased in size (very heavy and painful), patient with vision changes on 02/21/15 (resolved). Patient with ongoing facial swelling (worse than last time). Last rx (Lotrimin) was too expensive 36 dollars, patient did not pick-up.      HPI: Patient is a 69 y.o. female seen in the office today due to ongoing skin concerns. Pt did not take lotrimin due to cost. Weeping has improved but now skin is red, cracked and warm from chest into bilateral breast. Remains with dry, itchy skin.  Reports 5 days ago her face became swollen and she could barely see out of her eyes, improved the next day. No trouble swallowing or shortness of breath.  Unsure of trigger that is causing reaction. Nothing new.  No fevers or chills.   Review of Systems:  Review of Systems  Constitutional: Negative for fever and chills.  HENT: Negative for congestion.   Respiratory: Negative for cough, choking, shortness of breath and wheezing.   Cardiovascular: Negative for chest pain and palpitations.  Genitourinary: Negative for dysuria.  Musculoskeletal: Negative for myalgias.  Skin: Positive for rash (to arms and chest and breast).  Neurological: Negative for dizziness.    Past Medical History  Diagnosis Date  . Hypertension   . Hyperlipemia   . Generalized headaches   . Arthritis   . Depression    Past Surgical History  Procedure Laterality Date  . Abdominal hysterectomy  1980  . Ankle fracture surgery  2011    Left   . Appendectomy  1980   Social History:   reports that she has been  smoking Cigarettes.  She has a 15 pack-year smoking history. She has never used smokeless tobacco. She reports that she drinks alcohol. She reports that she does not use illicit drugs.  Family History  Problem Relation Age of Onset  . Cancer Mother   . Heart disease Mother   . Hypertension Mother   . Hyperlipidemia Mother   . Diabetes Other     Grand-daughter  . Depression Sister   . Depression Sister   . Kidney disease Son     Kidney Transplant   . Pancreatic disease Son     Pancrease Transplant     Medications: Patient's Medications  New Prescriptions   No medications on file  Previous Medications   ASPIRIN EC 81 MG TABLET    Take 1 tablet (81 mg total) by mouth daily.   ATORVASTATIN (LIPITOR) 80 MG TABLET    Take 1 tablet (80 mg total) by mouth daily. For cholesterol   BUPROPION (WELLBUTRIN XL) 300 MG 24 HR TABLET    Take 1 tablet (300 mg total) by mouth daily.   BYSTOLIC 5 MG TABLET    TAKE 1 TABLET BY MOUTH EVERY DAY FOR BLOOD PRESSURE   CHOLECALCIFEROL (VITAMIN D3) 2000 UNITS CAPSULE    Take 1 capsule (2,000 Units total) by mouth daily.   CLONAZEPAM (KLONOPIN) 1 MG TABLET    Take 1  tablet (1 mg total) by mouth at bedtime.   FLUOXETINE (PROZAC) 10 MG TABLET    Take 1 tablet (10 mg total) by mouth daily.   HYDROCHLOROTHIAZIDE (HYDRODIURIL) 12.5 MG TABLET    TAKE 1 TABLET BY MOUTH DAILY FOR SWELLING   HYDROCORTISONE 2.5 % CREAM    Apply topically 2 (two) times daily.   LAMOTRIGINE (LAMICTAL) 25 MG TABLET    Take 1 tablet (25 mg total) by mouth daily.   MIRABEGRON ER (MYRBETRIQ) 25 MG TB24 TABLET    Take 1 tablet (25 mg total) by mouth daily.   VALSARTAN (DIOVAN) 320 MG TABLET    Take one tablet by mouth once daily for blood pressure  Modified Medications   No medications on file  Discontinued Medications   CLOTRIMAZOLE (LOTRIMIN) 1 % CREAM    Apply 1 application topically 2 (two) times daily.     Physical Exam:  Filed Vitals:   02/26/15 1416  BP: 124/70  Pulse: 66    Temp: 97.9 F (36.6 C)  TempSrc: Oral  Height: 5\' 2"  (1.575 m)  Weight: 179 lb (81.194 kg)  SpO2: 96%   Body mass index is 32.73 kg/(m^2).  Physical Exam  Constitutional: She is oriented to person, place, and time. She appears well-developed and well-nourished. No distress.  HENT:  Head: Normocephalic and atraumatic.  Neck: Normal range of motion. Neck supple.  Cardiovascular: Normal rate, regular rhythm and normal heart sounds.   Pulmonary/Chest: Effort normal and breath sounds normal. No respiratory distress. She has no wheezes.  Lymphadenopathy:    She has no cervical adenopathy.  Neurological: She is alert and oriented to person, place, and time.  Skin: Skin is warm and dry. Rash noted. She is not diaphoretic. There is erythema.  Raised rash bilateral arms to wrist and to chest with erythema.  Psychiatric: She has a normal mood and affect.    Labs reviewed: Basic Metabolic Panel:  Recent Labs  06/02/14 0914 10/02/14 0930 02/06/15 1009  NA 141 145* 139  K 3.6 4.1 4.1  CL 101 107 100  CO2 24 18 23   GLUCOSE 87 109* 98  BUN 12 16 17   CREATININE 0.68 0.67 0.76  CALCIUM 10.1 9.5 9.8   Liver Function Tests: No results for input(s): AST, ALT, ALKPHOS, BILITOT, PROT, ALBUMIN in the last 8760 hours. No results for input(s): LIPASE, AMYLASE in the last 8760 hours. No results for input(s): AMMONIA in the last 8760 hours. CBC: No results for input(s): WBC, NEUTROABS, HGB, HCT, MCV, PLT in the last 8760 hours. Lipid Panel:  Recent Labs  06/02/14 0914 10/02/14 0930 02/06/15 1009  CHOL 188 162 183  HDL 44 37* 53  LDLCALC 105* 88 83  TRIG 194* 185* 234*  CHOLHDL 4.3 4.4 3.5   TSH: No results for input(s): TSH in the last 8760 hours. A1C: Lab Results  Component Value Date   HGBA1C 6.0* 02/06/2015     Assessment/Plan 1. Rash and nonspecific skin eruption -to use OTC lotrimin cream and apply to chest and bilateral arms twice daily -fluconazole (DIFLUCAN) 150  MG tablet; Take 1 tablet today and repeat in 3 days  Dispense: 2 tablet; Refill: 0 - Ambulatory referral to Dermatology   2. Cellulitis of trunk, unspecified site -secondary to rash, did not treat with any cream now with cellulitis - cephALEXin (KEFLEX) 500 MG capsule; Take 1 capsule (500 mg total) by mouth 4 (four) times daily.  Dispense: 28 capsule; Refill: 0  3. Facial swelling -appears  to have a recurrent reaction however unsure of trigger, will treat rash and refer to dermatology for ongoing testing - Ambulatory referral to Dermatology -to take claritin 10 mg daily for histamine and may take benadryl 25 mg 8 hours as needed  -pt given strict instructions to seek medical attention immediately for facial swelling as this could effect her airway and result in death. Pt understands severity of situation and agrees.   To keep follow up as scheduled with Dr Mariea Clonts for 03/04/14 Carlos American. Harle Battiest  [facility] [phone] 8 am - 5 pm) [phone] (after hours)

## 2015-03-05 ENCOUNTER — Ambulatory Visit (INDEPENDENT_AMBULATORY_CARE_PROVIDER_SITE_OTHER): Payer: Medicare Other | Admitting: Internal Medicine

## 2015-03-05 ENCOUNTER — Encounter: Payer: Self-pay | Admitting: Internal Medicine

## 2015-03-05 VITALS — BP 132/98 | HR 66 | Temp 97.4°F | Resp 20 | Ht 62.0 in | Wt 175.8 lb

## 2015-03-05 DIAGNOSIS — L03319 Cellulitis of trunk, unspecified: Secondary | ICD-10-CM | POA: Diagnosis not present

## 2015-03-05 DIAGNOSIS — R220 Localized swelling, mass and lump, head: Secondary | ICD-10-CM | POA: Diagnosis not present

## 2015-03-05 DIAGNOSIS — R21 Rash and other nonspecific skin eruption: Secondary | ICD-10-CM

## 2015-03-05 MED ORDER — LORATADINE 10 MG PO TABS: 10.0000 mg | ORAL_TABLET | Freq: Every day | ORAL | Status: DC

## 2015-03-05 MED ORDER — PREDNISONE 10 MG (21) PO TBPK: ORAL_TABLET | ORAL | Status: DC

## 2015-03-05 NOTE — Patient Instructions (Signed)
Coricidin bp

## 2015-03-05 NOTE — Progress Notes (Signed)
KLONOPIN  Take 1 tablet (1 mg total) by mouth at bedtime.     diphenhydrAMINE 25 MG tablet  Commonly known as:  BENADRYL  Take 25 mg by mouth every 8 (eight) hours as needed for itching.     fluconazole 150 MG tablet  Commonly known as:  DIFLUCAN  Take 1 tablet today and repeat in 3 days     FLUoxetine 10 MG tablet  Commonly known as:  PROZAC  Take 1 tablet (10 mg total) by mouth daily.     hydrochlorothiazide 12.5 MG tablet  Commonly known as:  HYDRODIURIL  TAKE 1 TABLET BY MOUTH DAILY FOR SWELLING     hydrocortisone 2.5 % cream  Apply topically 2 (two) times daily.     lamoTRIgine 25 MG tablet  Commonly known as:  LAMICTAL  Take 1 tablet (25 mg total) by mouth daily.     loratadine 10 MG tablet  Commonly known as:  CLARITIN  Take 10 mg by mouth daily.     mirabegron ER 25 MG Tb24 tablet  Commonly known as:  MYRBETRIQ    Take 1 tablet (25 mg total) by mouth daily.     valsartan 320 MG tablet  Commonly known as:  DIOVAN  Take one tablet by mouth once daily for blood pressure     Vitamin D3 2000 units capsule  Take 1 capsule (2,000 Units total) by mouth daily.        Health Maintenance  Topic Date Due  . Hepatitis C Screening  [birth year]  . COLONOSCOPY  01/22/1997  . PNA vac Low Risk Adult (2 of 2 - PPSV23) 07/01/2015  . INFLUENZA VACCINE  09/29/2015  . COLON CANCER SCREENING ANNUAL FOBT  10/21/2015  . MAMMOGRAM  02/11/2016  . TETANUS/TDAP  02/29/2020  . DEXA SCAN  Completed  . ZOSTAVAX  Completed    Physical Exam: Filed Vitals:   03/05/15 1016  BP: 132/98  Pulse: 66  Temp: 97.4 F (36.3 C)  TempSrc: Oral  Resp: 20  Height: 5\' 2"  (1.575 m)  Weight: 175 lb 12.8 oz (79.742 kg)  SpO2: 99%   Body mass index is 32.15 kg/(m^2). Physical Exam  Constitutional: She is oriented to person, place, and time. She appears well-developed and well-nourished. No distress.  Cardiovascular: Normal rate, regular rhythm and normal heart sounds.   Pulmonary/Chest: Effort normal and breath sounds normal.  Musculoskeletal: Normal range of motion.  Neurological: She is alert and oriented to person, place, and time.  Skin:  Mild swelling of her face persists; erythema over cheeks still present; bilateral breasts remain a dark red/purple though area beneath breasts is healed; still has some scaly regions in skin folds around neck, in arms; no rash on abdomen or legs at all; no oral mucosal lesions    Labs reviewed: Basic Metabolic Panel:  Recent Labs  06/02/14 0914 10/02/14 0930 02/06/15 1009  NA 141 145* 139  K 3.6 4.1 4.1  CL 101 107 100  CO2 24 18 23   GLUCOSE 87 109* 98  BUN 12 16 17   CREATININE 0.68 0.67 0.76  CALCIUM 10.1 9.5 9.8   Liver Function Tests: No results for input(s): AST, ALT, ALKPHOS, BILITOT, PROT, ALBUMIN in the last 8760 hours. No results for input(s): LIPASE, AMYLASE in  the last 8760 hours. No results for input(s): AMMONIA in the last 8760 hours. CBC: No results for input(s): WBC, NEUTROABS, HGB, HCT, MCV, PLT in the last 8760 hours. Lipid Panel:  Recent Labs  06/02/14  KLONOPIN  Take 1 tablet (1 mg total) by mouth at bedtime.     diphenhydrAMINE 25 MG tablet  Commonly known as:  BENADRYL  Take 25 mg by mouth every 8 (eight) hours as needed for itching.     fluconazole 150 MG tablet  Commonly known as:  DIFLUCAN  Take 1 tablet today and repeat in 3 days     FLUoxetine 10 MG tablet  Commonly known as:  PROZAC  Take 1 tablet (10 mg total) by mouth daily.     hydrochlorothiazide 12.5 MG tablet  Commonly known as:  HYDRODIURIL  TAKE 1 TABLET BY MOUTH DAILY FOR SWELLING     hydrocortisone 2.5 % cream  Apply topically 2 (two) times daily.     lamoTRIgine 25 MG tablet  Commonly known as:  LAMICTAL  Take 1 tablet (25 mg total) by mouth daily.     loratadine 10 MG tablet  Commonly known as:  CLARITIN  Take 10 mg by mouth daily.     mirabegron ER 25 MG Tb24 tablet  Commonly known as:  MYRBETRIQ    Take 1 tablet (25 mg total) by mouth daily.     valsartan 320 MG tablet  Commonly known as:  DIOVAN  Take one tablet by mouth once daily for blood pressure     Vitamin D3 2000 units capsule  Take 1 capsule (2,000 Units total) by mouth daily.        Health Maintenance  Topic Date Due  . Hepatitis C Screening  [birth year]  . COLONOSCOPY  01/22/1997  . PNA vac Low Risk Adult (2 of 2 - PPSV23) 07/01/2015  . INFLUENZA VACCINE  09/29/2015  . COLON CANCER SCREENING ANNUAL FOBT  10/21/2015  . MAMMOGRAM  02/11/2016  . TETANUS/TDAP  02/29/2020  . DEXA SCAN  Completed  . ZOSTAVAX  Completed    Physical Exam: Filed Vitals:   03/05/15 1016  BP: 132/98  Pulse: 66  Temp: 97.4 F (36.3 C)  TempSrc: Oral  Resp: 20  Height: 5\' 2"  (1.575 m)  Weight: 175 lb 12.8 oz (79.742 kg)  SpO2: 99%   Body mass index is 32.15 kg/(m^2). Physical Exam  Constitutional: She is oriented to person, place, and time. She appears well-developed and well-nourished. No distress.  Cardiovascular: Normal rate, regular rhythm and normal heart sounds.   Pulmonary/Chest: Effort normal and breath sounds normal.  Musculoskeletal: Normal range of motion.  Neurological: She is alert and oriented to person, place, and time.  Skin:  Mild swelling of her face persists; erythema over cheeks still present; bilateral breasts remain a dark red/purple though area beneath breasts is healed; still has some scaly regions in skin folds around neck, in arms; no rash on abdomen or legs at all; no oral mucosal lesions    Labs reviewed: Basic Metabolic Panel:  Recent Labs  06/02/14 0914 10/02/14 0930 02/06/15 1009  NA 141 145* 139  K 3.6 4.1 4.1  CL 101 107 100  CO2 24 18 23   GLUCOSE 87 109* 98  BUN 12 16 17   CREATININE 0.68 0.67 0.76  CALCIUM 10.1 9.5 9.8   Liver Function Tests: No results for input(s): AST, ALT, ALKPHOS, BILITOT, PROT, ALBUMIN in the last 8760 hours. No results for input(s): LIPASE, AMYLASE in  the last 8760 hours. No results for input(s): AMMONIA in the last 8760 hours. CBC: No results for input(s): WBC, NEUTROABS, HGB, HCT, MCV, PLT in the last 8760 hours. Lipid Panel:  Recent Labs  06/02/14  Patient ID: ROXANNE DECKELMAN, female   DOB: 1945-12-11, 70 y.o.   MRN: [id number]   Location: [place] Provider: Rexene Edison. Mariea Clonts, D.O., C.M.D.  Goals of Care: Advanced Directive information Does patient have an advance directive?: No, Would patient like information on creating an advanced directive?: Yes - Educational materials given  Chief Complaint  Patient presents with  . Medical Management of Chronic Issues    1 week follow-up for rash  . OTHER    Labs printed    HPI: Patient is a 70 y.o. female seen in the office today for med mgt of chronic diseases.  Unfortunately, her rash has still not fully resolved and she has not seen dermatology yet.  We focused on this.    Using the claritin daily.  Out of the hydrocortisone 2.5% cream--20g--long out of it.  Using a lotion and vaseline for dryness, but taking multiple showers daily.  Has been less itchy in daytime, but itching gets bad when wakes up sweating at night.  Still on keflex 4x a day.  Took the diflucan pills and out of them.    Does not yet have derm appt.  Wants to go to [facility] dermatology.    Copy of labs given to her and she had already been called about them.    Review of Systems:  Review of Systems  Constitutional: Negative for fever and chills.  HENT: Negative for congestion.   Respiratory: Negative for shortness of breath.   Cardiovascular: Negative for chest pain and leg swelling.  Gastrointestinal: Negative for abdominal pain.  Genitourinary: Negative for dysuria.  Musculoskeletal: Negative for myalgias and falls.  Skin: Positive for itching and rash.  Neurological: Negative for dizziness.  Endo/Heme/Allergies: Does not bruise/bleed easily.  Psychiatric/Behavioral: Positive for depression. Negative for memory loss. The patient is nervous/anxious and has insomnia.     Past Medical History  Diagnosis Date  . Hypertension   . Hyperlipemia   . Generalized headaches   . Arthritis   .  Depression     Past Surgical History  Procedure Laterality Date  . Abdominal hysterectomy  1980  . Ankle fracture surgery  2011    Left   . Appendectomy  1980    Allergies  Allergen Reactions  . Latex Rash  . Powders [Starch] Rash    Talcum powder, has corn starch allergy  . Sulfur     Unable to recall type pf reaction   . Nickel       Medication List       This list is accurate as of: 03/05/15 10:55 AM.  Always use your most recent med list.               aspirin EC 81 MG tablet  Take 1 tablet (81 mg total) by mouth daily.     atorvastatin 80 MG tablet  Commonly known as:  LIPITOR  Take 1 tablet (80 mg total) by mouth daily. For cholesterol     buPROPion 300 MG 24 hr tablet  Commonly known as:  WELLBUTRIN XL  Take 1 tablet (300 mg total) by mouth daily.     BYSTOLIC 5 MG tablet  Generic drug:  nebivolol  TAKE 1 TABLET BY MOUTH EVERY DAY FOR BLOOD PRESSURE     cephALEXin 500 MG capsule  Commonly known as:  KEFLEX  Take 1 capsule (500 mg total) by mouth 4 (four) times daily.     clonazePAM 1 MG tablet  Commonly known as:

## 2015-04-09 ENCOUNTER — Ambulatory Visit: Payer: Medicare Other | Admitting: Internal Medicine

## 2015-04-24 DIAGNOSIS — L309 Dermatitis, unspecified: Secondary | ICD-10-CM | POA: Diagnosis not present

## 2015-04-30 ENCOUNTER — Other Ambulatory Visit: Payer: Self-pay | Admitting: Internal Medicine

## 2015-05-04 NOTE — Telephone Encounter (Signed)
rx called into pharmacy

## 2015-05-08 ENCOUNTER — Encounter: Payer: Self-pay | Admitting: Internal Medicine

## 2015-05-08 ENCOUNTER — Ambulatory Visit (INDEPENDENT_AMBULATORY_CARE_PROVIDER_SITE_OTHER): Payer: Medicare Other | Admitting: Internal Medicine

## 2015-05-08 VITALS — BP 138/80 | HR 66 | Temp 98.3°F | Ht 62.0 in | Wt 164.0 lb

## 2015-05-08 DIAGNOSIS — R739 Hyperglycemia, unspecified: Secondary | ICD-10-CM | POA: Diagnosis not present

## 2015-05-08 DIAGNOSIS — I10 Essential (primary) hypertension: Secondary | ICD-10-CM

## 2015-05-08 DIAGNOSIS — M170 Bilateral primary osteoarthritis of knee: Secondary | ICD-10-CM | POA: Diagnosis not present

## 2015-05-08 DIAGNOSIS — F319 Bipolar disorder, unspecified: Secondary | ICD-10-CM

## 2015-05-08 DIAGNOSIS — Z1159 Encounter for screening for other viral diseases: Secondary | ICD-10-CM

## 2015-05-08 DIAGNOSIS — N3941 Urge incontinence: Secondary | ICD-10-CM | POA: Diagnosis not present

## 2015-05-08 DIAGNOSIS — E785 Hyperlipidemia, unspecified: Secondary | ICD-10-CM

## 2015-05-08 DIAGNOSIS — Z720 Tobacco use: Secondary | ICD-10-CM | POA: Diagnosis not present

## 2015-05-08 MED ORDER — MIRABEGRON ER 25 MG PO TB24: 25.0000 mg | ORAL_TABLET | Freq: Every day | ORAL | Status: DC

## 2015-05-08 NOTE — Progress Notes (Signed)
capsule  Take 1 capsule (2,000 Units total) by mouth daily.      Functional Status Survey: Is the patient deaf or have difficulty hearing?: No Does the patient have difficulty seeing, even when wearing glasses/contacts?: No Does the patient have difficulty concentrating, remembering, or making decisions?: No Does the patient have difficulty walking or climbing stairs?: No Does the patient have difficulty dressing or bathing?: No Does the patient have difficulty doing errands alone such as visiting a doctor's office or shopping?: No   Review of Systems:  Review of Systems  Constitutional: Negative for fever, chills and malaise/fatigue.  HENT: Negative for hearing loss.   Eyes: Negative for blurred vision.  Respiratory: Negative for shortness of breath.   Cardiovascular: Negative for chest pain and leg swelling.  Gastrointestinal: Negative for abdominal pain, constipation, blood in stool and melena.  Genitourinary: Positive for urgency. Negative for dysuria, frequency and hematuria.  Musculoskeletal:  Positive for joint pain. Negative for falls.  Skin: Positive for rash. Negative for itching.  Neurological: Negative for dizziness, loss of consciousness, weakness and headaches.  Psychiatric/Behavioral: Negative for memory loss.       Bipolar I disorder    Health Maintenance  Topic Date Due  . Hepatitis C Screening  1945/10/23  . PNA vac Low Risk Adult (2 of 2 - PPSV23) 07/01/2015  . INFLUENZA VACCINE  09/29/2015  . COLON CANCER SCREENING ANNUAL FOBT  10/21/2015  . MAMMOGRAM  02/11/2016  . TETANUS/TDAP  02/29/2020  . COLONOSCOPY  10/17/2024  . DEXA SCAN  Completed  . ZOSTAVAX  Completed    Physical Exam: Filed Vitals:   05/08/15 0945  BP: 138/80  Pulse: 66  Temp: 98.3 F (36.8 C)  TempSrc: Oral  Height: 5\' 2"  (1.575 m)  Weight: 164 lb (74.39 kg)  SpO2: 98%   Body mass index is 29.99 kg/(m^2). Physical Exam  Constitutional: She is oriented to person, place, and time. She appears well-developed and well-nourished. No distress.  Cardiovascular: Normal rate, regular rhythm, normal heart sounds and intact distal pulses.   Pulmonary/Chest: Effort normal.  Some coarse rhonchorous sounds   Abdominal: Bowel sounds are normal.  Musculoskeletal:  Walks w/o assistive device; slightly limp with knee pain  Neurological: She is alert and oriented to person, place, and time.  Skin: No erythema.  No scaliness or erythema remains on visible skin, she's not itching during visit this time  Psychiatric: She has a normal mood and affect.    Labs reviewed: Basic Metabolic Panel:  Recent Labs  06/02/14 0914 10/02/14 0930 02/06/15 1009  NA 141 145* 139  K 3.6 4.1 4.1  CL 101 107 100  CO2 24 18 23   GLUCOSE 87 109* 98  BUN 12 16 17   CREATININE 0.68 0.67 0.76  CALCIUM 10.1 9.5 9.8   Liver Function Tests: No results for input(s): AST, ALT, ALKPHOS, BILITOT, PROT, ALBUMIN in the last 8760 hours. No results for input(s): LIPASE, AMYLASE in the last 8760 hours. No results for  input(s): AMMONIA in the last 8760 hours. CBC: No results for input(s): WBC, NEUTROABS, HGB, HCT, MCV, PLT in the last 8760 hours. Lipid Panel:  Recent Labs  06/02/14 0914 10/02/14 0930 02/06/15 1009  CHOL 188 162 183  HDL 44 37* 53  LDLCALC 105* 88 83  TRIG 194* 185* 234*  CHOLHDL 4.3 4.4 3.5   Lab Results  Component Value Date   HGBA1C 6.0* 02/06/2015    Assessment/Plan 1. Hyperlipidemia - LDL has been trending downward on  capsule  Take 1 capsule (2,000 Units total) by mouth daily.      Functional Status Survey: Is the patient deaf or have difficulty hearing?: No Does the patient have difficulty seeing, even when wearing glasses/contacts?: No Does the patient have difficulty concentrating, remembering, or making decisions?: No Does the patient have difficulty walking or climbing stairs?: No Does the patient have difficulty dressing or bathing?: No Does the patient have difficulty doing errands alone such as visiting a doctor's office or shopping?: No   Review of Systems:  Review of Systems  Constitutional: Negative for fever, chills and malaise/fatigue.  HENT: Negative for hearing loss.   Eyes: Negative for blurred vision.  Respiratory: Negative for shortness of breath.   Cardiovascular: Negative for chest pain and leg swelling.  Gastrointestinal: Negative for abdominal pain, constipation, blood in stool and melena.  Genitourinary: Positive for urgency. Negative for dysuria, frequency and hematuria.  Musculoskeletal:  Positive for joint pain. Negative for falls.  Skin: Positive for rash. Negative for itching.  Neurological: Negative for dizziness, loss of consciousness, weakness and headaches.  Psychiatric/Behavioral: Negative for memory loss.       Bipolar I disorder    Health Maintenance  Topic Date Due  . Hepatitis C Screening  1945/10/23  . PNA vac Low Risk Adult (2 of 2 - PPSV23) 07/01/2015  . INFLUENZA VACCINE  09/29/2015  . COLON CANCER SCREENING ANNUAL FOBT  10/21/2015  . MAMMOGRAM  02/11/2016  . TETANUS/TDAP  02/29/2020  . COLONOSCOPY  10/17/2024  . DEXA SCAN  Completed  . ZOSTAVAX  Completed    Physical Exam: Filed Vitals:   05/08/15 0945  BP: 138/80  Pulse: 66  Temp: 98.3 F (36.8 C)  TempSrc: Oral  Height: 5\' 2"  (1.575 m)  Weight: 164 lb (74.39 kg)  SpO2: 98%   Body mass index is 29.99 kg/(m^2). Physical Exam  Constitutional: She is oriented to person, place, and time. She appears well-developed and well-nourished. No distress.  Cardiovascular: Normal rate, regular rhythm, normal heart sounds and intact distal pulses.   Pulmonary/Chest: Effort normal.  Some coarse rhonchorous sounds   Abdominal: Bowel sounds are normal.  Musculoskeletal:  Walks w/o assistive device; slightly limp with knee pain  Neurological: She is alert and oriented to person, place, and time.  Skin: No erythema.  No scaliness or erythema remains on visible skin, she's not itching during visit this time  Psychiatric: She has a normal mood and affect.    Labs reviewed: Basic Metabolic Panel:  Recent Labs  06/02/14 0914 10/02/14 0930 02/06/15 1009  NA 141 145* 139  K 3.6 4.1 4.1  CL 101 107 100  CO2 24 18 23   GLUCOSE 87 109* 98  BUN 12 16 17   CREATININE 0.68 0.67 0.76  CALCIUM 10.1 9.5 9.8   Liver Function Tests: No results for input(s): AST, ALT, ALKPHOS, BILITOT, PROT, ALBUMIN in the last 8760 hours. No results for input(s): LIPASE, AMYLASE in the last 8760 hours. No results for  input(s): AMMONIA in the last 8760 hours. CBC: No results for input(s): WBC, NEUTROABS, HGB, HCT, MCV, PLT in the last 8760 hours. Lipid Panel:  Recent Labs  06/02/14 0914 10/02/14 0930 02/06/15 1009  CHOL 188 162 183  HDL 44 37* 53  LDLCALC 105* 88 83  TRIG 194* 185* 234*  CHOLHDL 4.3 4.4 3.5   Lab Results  Component Value Date   HGBA1C 6.0* 02/06/2015    Assessment/Plan 1. Hyperlipidemia - LDL has been trending downward on  Patient ID: Brittney Lang, female   DOB: [birth year], 70 y.o.   MRN: [id number]   Location:  [facility] clinic Provider:  Cypress Hinkson L. Mariea Clonts, D.O., C.M.D.  Code Status: full code--needs to do her advance directive  Goals of Care:  Advanced Directives 05/08/2015  Does patient have an advance directive? No  Would patient like information on creating an advanced directive? Yes - Multimedia programmer Complaint  Patient presents with  . Medical Management of Chronic Issues    3 mth follow-up    HPI: Patient is a 70 y.o. female seen today for medical management of chronic diseases.    Rash:  Getting better finally with prednisone pills and cream from [facility], Dr. Elvera Lennox.  Seems she needed a more prolonged steroid course.    Smoking:  Doing about the same.  Still not ready to quit altogether.  BP at goal today with current medications.  Is adherent.  Bipolar disorder:  Not seeing anyone recently for this.  Continues on same regimen.  Seems to be stable  Urge incontinence:  Out of myrbetriq.  Has been doing better though with this.    New Rx entered and samples provided.  Past Medical History  Diagnosis Date  . Hypertension   . Hyperlipemia   . Generalized headaches   . Arthritis   . Depression     Past Surgical History  Procedure Laterality Date  . Abdominal hysterectomy  1980  . Ankle fracture surgery  2011    Left   . Appendectomy  1980    Allergies  Allergen Reactions  . Latex Rash  . Powders [Starch] Rash    Talcum powder, has corn starch allergy  . Sulfur     Unable to recall type pf reaction   . Nickel       Medication List       This list is accurate as of: 05/08/15 11:59 PM.  Always use your most recent med list.               aspirin EC 81 MG tablet  Take 1 tablet (81 mg total) by mouth daily.     atorvastatin 80 MG tablet  Commonly known as:  LIPITOR  Take 1 tablet (80 mg total) by mouth daily. For cholesterol     buPROPion 300 MG 24 hr tablet  Commonly known as:  WELLBUTRIN XL  Take 1 tablet (300 mg total) by mouth daily.     BYSTOLIC 5 MG tablet  Generic drug:  nebivolol  TAKE 1 TABLET BY MOUTH EVERY DAY FOR BLOOD PRESSURE     clonazePAM 1 MG tablet  Commonly known as:  KLONOPIN  TAKE 1 TABLET BY MOUTH AT BEDTIME     FLUoxetine 10 MG tablet  Commonly known as:  PROZAC  Take 1 tablet (10 mg total) by mouth daily.     hydrochlorothiazide 12.5 MG tablet  Commonly known as:  HYDRODIURIL  TAKE 1 TABLET BY MOUTH DAILY FOR SWELLING     lamoTRIgine 25 MG tablet  Commonly known as:  LAMICTAL  Take 1 tablet (25 mg total) by mouth daily.     mirabegron ER 25 MG Tb24 tablet  Commonly known as:  MYRBETRIQ  Take 1 tablet (25 mg total) by mouth daily.     valsartan 320 MG tablet  Commonly known as:  DIOVAN  Take one tablet by mouth once daily for blood pressure     Vitamin D3 2000 units

## 2015-06-01 ENCOUNTER — Other Ambulatory Visit: Payer: Self-pay | Admitting: Internal Medicine

## 2015-06-05 ENCOUNTER — Telehealth: Payer: Self-pay | Admitting: *Deleted

## 2015-06-05 NOTE — Telephone Encounter (Signed)
Per pt she states her insurance will not pay for Prozac and wanted her to switch to something else. Per pt her cost is 34.00, which I tried to advise her insurance is paying and that is just her copay. Pt would like to switch to something cheaper. i asked if she knew what is cheaper and she asked that we contact the pharmacy. Please advise

## 2015-06-06 NOTE — Telephone Encounter (Signed)
She will need to taper off her prozac (unless of course she's already out).  She should take 1/2 pill for 3 days then stop.  She can then begin citalopram 10mg  po daily for her bipolar depression.  This is typically on the [facility] $4 list.  Not sure what it will cost at [facility], but we can send it in and see.  If she runs into problems with this change, I would like her to seek a psychiatrist for further advice.

## 2015-06-10 ENCOUNTER — Other Ambulatory Visit: Payer: Self-pay

## 2015-06-10 MED ORDER — FLUOXETINE HCL 10 MG PO CAPS: 10.0000 mg | ORAL_CAPSULE | Freq: Every day | ORAL | Status: DC

## 2015-06-11 MED ORDER — CITALOPRAM HYDROBROMIDE 10 MG PO TABS: 10.0000 mg | ORAL_TABLET | Freq: Every day | ORAL | Status: DC

## 2015-06-11 NOTE — Telephone Encounter (Signed)
Spoke with patient and advised results, per pt she has already stopped the prozac, pt will call the pharmacy to see how much citalopram is to see if she can afford it before starting.

## 2015-08-13 ENCOUNTER — Ambulatory Visit: Payer: Medicare Other | Admitting: Internal Medicine

## 2015-09-18 ENCOUNTER — Encounter: Payer: Self-pay | Admitting: Internal Medicine

## 2015-09-18 ENCOUNTER — Ambulatory Visit (INDEPENDENT_AMBULATORY_CARE_PROVIDER_SITE_OTHER): Payer: Medicare Other | Admitting: Internal Medicine

## 2015-09-18 ENCOUNTER — Other Ambulatory Visit: Payer: Self-pay | Admitting: Internal Medicine

## 2015-09-18 VITALS — BP 140/80 | HR 62 | Temp 98.3°F | Wt 164.0 lb

## 2015-09-18 DIAGNOSIS — Z720 Tobacco use: Secondary | ICD-10-CM

## 2015-09-18 DIAGNOSIS — I10 Essential (primary) hypertension: Secondary | ICD-10-CM | POA: Diagnosis not present

## 2015-09-18 DIAGNOSIS — M170 Bilateral primary osteoarthritis of knee: Secondary | ICD-10-CM

## 2015-09-18 DIAGNOSIS — R739 Hyperglycemia, unspecified: Secondary | ICD-10-CM | POA: Diagnosis not present

## 2015-09-18 DIAGNOSIS — M810 Age-related osteoporosis without current pathological fracture: Secondary | ICD-10-CM | POA: Diagnosis not present

## 2015-09-18 DIAGNOSIS — Z1159 Encounter for screening for other viral diseases: Secondary | ICD-10-CM

## 2015-09-18 DIAGNOSIS — F319 Bipolar disorder, unspecified: Secondary | ICD-10-CM | POA: Diagnosis not present

## 2015-09-18 DIAGNOSIS — N3941 Urge incontinence: Secondary | ICD-10-CM

## 2015-09-18 DIAGNOSIS — N3281 Overactive bladder: Secondary | ICD-10-CM

## 2015-09-18 DIAGNOSIS — E785 Hyperlipidemia, unspecified: Secondary | ICD-10-CM

## 2015-09-18 DIAGNOSIS — R21 Rash and other nonspecific skin eruption: Secondary | ICD-10-CM | POA: Diagnosis not present

## 2015-09-18 LAB — LIPID PANEL
Cholesterol: 158 mg/dL (ref 125–200)
HDL: 55 mg/dL (ref >=46)
LDL Cholesterol: 83 mg/dL (ref <130)
Total CHOL/HDL Ratio: 2.9 Ratio (ref <=5.0)
Triglycerides: 102 mg/dL (ref <150)
VLDL: 20 mg/dL (ref <30)

## 2015-09-18 LAB — HEMOGLOBIN A1C
Hgb A1c MFr Bld: 5.6 % (ref <5.7)
Mean Plasma Glucose: 114 mg/dL

## 2015-09-18 MED ORDER — CLONAZEPAM 1 MG PO TABS: 1.0000 mg | ORAL_TABLET | Freq: Every day | ORAL | Status: DC

## 2015-09-18 MED ORDER — VALSARTAN 320 MG PO TABS: 320.0000 mg | ORAL_TABLET | Freq: Every day | ORAL | Status: AC

## 2015-09-18 MED ORDER — HYDROCHLOROTHIAZIDE 12.5 MG PO TABS: 12.5000 mg | ORAL_TABLET | Freq: Every day | ORAL | Status: DC

## 2015-09-18 MED ORDER — NEBIVOLOL HCL 5 MG PO TABS: 5.0000 mg | ORAL_TABLET | Freq: Every day | ORAL | Status: DC

## 2015-09-18 MED ORDER — LAMOTRIGINE 25 MG PO TABS: 25.0000 mg | ORAL_TABLET | Freq: Every day | ORAL | Status: AC

## 2015-09-18 MED ORDER — VITAMIN D3 50 MCG (2000 UT) PO CAPS: 2000.0000 [IU] | ORAL_CAPSULE | Freq: Every day | ORAL | Status: AC

## 2015-09-18 MED ORDER — BUPROPION HCL ER (XL) 300 MG PO TB24: 300.0000 mg | ORAL_TABLET | Freq: Every day | ORAL | Status: DC

## 2015-09-18 MED ORDER — ATORVASTATIN CALCIUM 80 MG PO TABS: 80.0000 mg | ORAL_TABLET | Freq: Every day | ORAL | Status: AC

## 2015-09-18 MED ORDER — MIRABEGRON ER 25 MG PO TB24: 25.0000 mg | ORAL_TABLET | Freq: Every day | ORAL | Status: DC

## 2015-09-18 MED ORDER — FLUOXETINE HCL 10 MG PO CAPS: 10.0000 mg | ORAL_CAPSULE | Freq: Every day | ORAL | Status: DC

## 2015-09-18 MED ORDER — CITALOPRAM HYDROBROMIDE 10 MG PO TABS: 10.0000 mg | ORAL_TABLET | Freq: Every day | ORAL | Status: DC

## 2015-09-18 MED ORDER — PREDNISONE 10 MG PO TABS: ORAL_TABLET | ORAL | Status: DC

## 2015-09-18 NOTE — Patient Instructions (Signed)
Please call to schedule your mammogram and bone density.    Continue to cut back on your cigarettes.

## 2015-09-18 NOTE — Addendum Note (Signed)
Addended by: Despina Hidden on: 09/18/2015 09:27 AM   Modules accepted: Orders

## 2015-09-18 NOTE — Progress Notes (Signed)
Location:  [facility] clinic Provider:  Cohen Doleman L. Renato Gails, D.O., C.M.D.  Goals of Care:  Advanced Directives 09/18/2015  Does patient have an advance directive? Yes  Does patient want to make changes to advanced directive? Yes - information given  Would patient like information on creating an advanced directive? -     Chief Complaint  Patient presents with  . Medical Management of Chronic Issues    3 mth follow-up    HPI: Patient is a 70 y.o. female seen today for medical management of chronic diseases.   Has been using the cream.  Her skin is still dark on different places.  She thinks she needs prednisone then for 2 wks.  Still itchy.  Sister needs surgery next week due to colon polyps.      Is still smoking.  Is trying to cut down.  Did have 1/2 cigarette this am.  BP initial 140/80.  Has a stepdaughter who lives in [place].  She has a brain tumor.    OAB: bladder doing well with the myrbetriq.     Bipolar:  Mood about the same and taking her medication. Has to drive to [facility] for wedding reception and doesn't like going out.  Feels fine staying home.  Knees are doing well right now in the hot weather.  Past Medical History  Diagnosis Date  . Hypertension   . Hyperlipemia   . Generalized headaches   . Arthritis   . Depression     Past Surgical History  Procedure Laterality Date  . Abdominal hysterectomy  1980  . Ankle fracture surgery  2011    Left   . Appendectomy  1980    Allergies  Allergen Reactions  . Latex Rash  . Powders [Starch] Rash    Talcum powder, has corn starch allergy  . Sulfur     Unable to recall type pf reaction   . Nickel       Medication List       This list is accurate as of: 09/18/15  8:21 AM.  Always use your most recent med list.               aspirin EC 81 MG tablet  Take 1 tablet (81 mg total) by mouth daily.     atorvastatin 80 MG tablet  Commonly known as:  LIPITOR  Take 1 tablet (80 mg total) by mouth daily. For  cholesterol     buPROPion 300 MG 24 hr tablet  Commonly known as:  WELLBUTRIN XL  Take 1 tablet (300 mg total) by mouth daily.     BYSTOLIC 5 MG tablet  Generic drug:  nebivolol  TAKE 1 TABLET BY MOUTH EVERY DAY FOR BLOOD PRESSURE     citalopram 10 MG tablet  Commonly known as:  CELEXA  Take 1 tablet (10 mg total) by mouth daily.     clonazePAM 1 MG tablet  Commonly known as:  KLONOPIN  TAKE 1 TABLET BY MOUTH AT BEDTIME     FLUoxetine 10 MG capsule  Commonly known as:  PROZAC  Take 1 capsule (10 mg total) by mouth daily.     hydrochlorothiazide 12.5 MG tablet  Commonly known as:  HYDRODIURIL  TAKE 1 TABLET BY MOUTH DAILY FOR SWELLING     lamoTRIgine 25 MG tablet  Commonly known as:  LAMICTAL  Take 1 tablet (25 mg total) by mouth daily.     mirabegron ER 25 MG Tb24 tablet  Commonly known as:  MYRBETRIQ  Take 1 tablet (25 mg total) by mouth daily.     triamcinolone cream 0.1 %  Commonly known as:  KENALOG     valsartan 320 MG tablet  Commonly known as:  DIOVAN  Take one tablet by mouth once daily for blood pressure     Vitamin D3 2000 units capsule  Take 1 capsule (2,000 Units total) by mouth daily.        Review of Systems:  Review of Systems  Constitutional: Negative for fever and chills.  HENT: Negative for congestion and hearing loss.   Eyes: Negative for blurred vision.  Respiratory: Negative for cough and shortness of breath.   Cardiovascular: Negative for chest pain and leg swelling.  Gastrointestinal: Negative for abdominal pain, constipation, blood in stool and melena.  Genitourinary: Negative for dysuria, urgency and frequency.       Improved frequency with myrbetriq  Musculoskeletal: Positive for joint pain. Negative for falls.  Skin: Positive for itching and rash.  Neurological: Negative for dizziness and loss of consciousness.  Endo/Heme/Allergies: Does not bruise/bleed easily.  Psychiatric/Behavioral: Negative for memory loss.       Bipolar     Health Maintenance  Topic Date Due  . Hepatitis C Screening  04/30/1945  . PNA vac Low Risk Adult (2 of 2 - PPSV23) 07/01/2015  . INFLUENZA VACCINE  09/29/2015  . COLON CANCER SCREENING ANNUAL FOBT  10/21/2015  . MAMMOGRAM  02/11/2016  . TETANUS/TDAP  02/29/2020  . COLONOSCOPY  10/17/2024  . DEXA SCAN  Completed  . ZOSTAVAX  Completed    Physical Exam: Filed Vitals:   09/18/15 0810  BP: 140/80  Pulse: 62  Temp: 98.3 F (36.8 C)  TempSrc: Oral  Weight: 164 lb (74.39 kg)  SpO2: 96%   Body mass index is 29.99 kg/(m^2). Physical Exam  Constitutional: She is oriented to person, place, and time. She appears well-developed and well-nourished.  Cardiovascular: Normal rate, regular rhythm, normal heart sounds and intact distal pulses.   Pulmonary/Chest: Effort normal and breath sounds normal. No respiratory distress. She has no wheezes.  Abdominal: Bowel sounds are normal.  Musculoskeletal: Normal range of motion.  Neurological: She is alert and oriented to person, place, and time.  Skin: Skin is warm and dry.  Hyperpigmented areas of legs and arms, arms these areas are raised, but not on legs  Psychiatric: She has a normal mood and affect.    Labs reviewed: Basic Metabolic Panel:  Recent Labs  62/95/28 0930 02/06/15 1009  NA 145* 139  K 4.1 4.1  CL 107 100  CO2 18 23  GLUCOSE 109* 98  BUN 16 17  CREATININE 0.67 0.76  CALCIUM 9.5 9.8   Liver Function Tests: No results for input(s): AST, ALT, ALKPHOS, BILITOT, PROT, ALBUMIN in the last 8760 hours. No results for input(s): LIPASE, AMYLASE in the last 8760 hours. No results for input(s): AMMONIA in the last 8760 hours. CBC: No results for input(s): WBC, NEUTROABS, HGB, HCT, MCV, PLT in the last 8760 hours. Lipid Panel:  Recent Labs  10/02/14 0930 02/06/15 1009  CHOL 162 183  HDL 37* 53  LDLCALC 88 83  TRIG 185* 234*  CHOLHDL 4.4 3.5   Lab Results  Component Value Date   HGBA1C 6.0* 02/06/2015    Assessment/Plan 1. Rash and nonspecific skin eruption - will treat again with a course of prednisone, then, if not staying gone after that, must return to dermatology b/c this should not be ongoing  - predniSONE (DELTASONE)  10 MG tablet; 6 for 2 days, 5 for 2 days, 4 for 2 days, 3 for 2 days, 2 for 2 days, 1 for 2 days, 1/2 for 2 days  Dispense: 33 tablet; Refill: 0  2. Essential hypertension, benign -bp borderline today, had smoked a cigarette, counseled again about that and continue same meds, 1 month of samples of bystolic also provided to help with costs as she could not even pay to get back home today  3. Hyperglycemia -cont to work on diet and exercise, weight is stable -recheck today--expect it will still be high due to prednisone  4. Tobacco abuse -ongoing, but has been cutting down gradually, contributes to elevated bps  5. Hyperlipidemia -recheck flp today  6. Bipolar 1 disorder (HCC) -cont current therapy with 2 ssris per psych, clonazapem, lamictal  7. OAB (overactive bladder) -cont myrbetriq 25mg  which has been very helpful -given 2 wks of samples  8. Primary osteoarthritis of both knees -doing better lately with fewer stairs to do at home and warmer weather  9. Need for hepatitis C screening test -was ordered for today last time, but had to be reentered due to our lab change so done today  Labs/tests ordered:   Orders Placed This Encounter  Procedures  . Hemoglobin A1c    Standing Status: Future     Number of Occurrences:      Standing Expiration Date: 09/17/2016  . Lipid panel    Standing Status: Future     Number of Occurrences:      Standing Expiration Date: 09/18/2016  . Hepatitis C antibody, reflex    Standing Status: Future     Number of Occurrences:      Standing Expiration Date: 09/17/2016   Next appt: 3 mos med mgt  Douglas Rooks L. Mandela Bello, D.O. Geriatrics [facility] [address], [place] [address] Cell  Phone (Mon-Fri 8am-5pm):  [phone] On Call:  [phone] & follow prompts after 5pm & weekends Office Phone:  [phone] Office Fax:  [phone]

## 2015-09-19 LAB — HEPATITIS C ANTIBODY: HCV Ab: NEGATIVE

## 2015-09-22 ENCOUNTER — Encounter: Payer: Self-pay | Admitting: *Deleted

## 2015-12-21 ENCOUNTER — Other Ambulatory Visit: Payer: Medicare Other

## 2015-12-21 DIAGNOSIS — R739 Hyperglycemia, unspecified: Secondary | ICD-10-CM | POA: Diagnosis not present

## 2015-12-21 DIAGNOSIS — Z1159 Encounter for screening for other viral diseases: Secondary | ICD-10-CM

## 2015-12-21 DIAGNOSIS — E785 Hyperlipidemia, unspecified: Secondary | ICD-10-CM

## 2015-12-21 LAB — LIPID PANEL
Cholesterol: 175 mg/dL (ref 125–200)
HDL: 48 mg/dL (ref >=46)
LDL Cholesterol: 93 mg/dL (ref <130)
Total CHOL/HDL Ratio: 3.6 Ratio (ref <=5.0)
Triglycerides: 171 mg/dL — ABNORMAL HIGH (ref <150)
VLDL: 34 mg/dL — ABNORMAL HIGH (ref <30)

## 2015-12-21 LAB — HEPATITIS C ANTIBODY
HCV Ab: NEGATIVE
HCV Ab: NEGATIVE

## 2015-12-22 LAB — HEMOGLOBIN A1C
Hgb A1c MFr Bld: 5.6 % (ref <5.7)
Mean Plasma Glucose: 114 mg/dL

## 2015-12-24 ENCOUNTER — Ambulatory Visit: Payer: Medicare Other | Admitting: Internal Medicine

## 2015-12-24 ENCOUNTER — Encounter: Payer: Self-pay | Admitting: *Deleted

## 2016-02-01 ENCOUNTER — Ambulatory Visit (INDEPENDENT_AMBULATORY_CARE_PROVIDER_SITE_OTHER): Payer: Medicare Other | Admitting: Internal Medicine

## 2016-02-01 ENCOUNTER — Encounter: Payer: Self-pay | Admitting: Internal Medicine

## 2016-02-01 ENCOUNTER — Ambulatory Visit: Payer: Medicare Other

## 2016-02-01 VITALS — BP 172/98 | HR 73 | Temp 98.1°F | Ht 61.5 in | Wt 169.4 lb

## 2016-02-01 VITALS — BP 172/98 | HR 73 | Temp 98.1°F | Ht 62.0 in | Wt 169.0 lb

## 2016-02-01 DIAGNOSIS — F319 Bipolar disorder, unspecified: Secondary | ICD-10-CM

## 2016-02-01 DIAGNOSIS — E785 Hyperlipidemia, unspecified: Secondary | ICD-10-CM | POA: Diagnosis not present

## 2016-02-01 DIAGNOSIS — Z23 Encounter for immunization: Secondary | ICD-10-CM | POA: Diagnosis not present

## 2016-02-01 DIAGNOSIS — E7800 Pure hypercholesterolemia, unspecified: Secondary | ICD-10-CM

## 2016-02-01 DIAGNOSIS — G4700 Insomnia, unspecified: Secondary | ICD-10-CM | POA: Diagnosis not present

## 2016-02-01 DIAGNOSIS — R739 Hyperglycemia, unspecified: Secondary | ICD-10-CM | POA: Diagnosis not present

## 2016-02-01 DIAGNOSIS — Z0000 Encounter for general adult medical examination without abnormal findings: Secondary | ICD-10-CM | POA: Diagnosis not present

## 2016-02-01 DIAGNOSIS — Z720 Tobacco use: Secondary | ICD-10-CM | POA: Diagnosis not present

## 2016-02-01 DIAGNOSIS — I10 Essential (primary) hypertension: Secondary | ICD-10-CM | POA: Diagnosis not present

## 2016-02-01 DIAGNOSIS — Z7189 Other specified counseling: Secondary | ICD-10-CM

## 2016-02-01 MED ORDER — BUPROPION HCL ER (XL) 300 MG PO TB24: 300.0000 mg | ORAL_TABLET | Freq: Every day | ORAL | 3 refills | Status: DC

## 2016-02-01 NOTE — Patient Instructions (Addendum)
Ms. Pagliuca , Thank you for taking time to come for your Medicare Wellness Visit. I appreciate your ongoing commitment to your health goals. Please review the following plan we discussed and let me know if I can assist you in the future.   These are the goals we discussed: Goals    . Quit smoking / using tobacco    . water intake          Starting 02/01/16, I will attempt to increase my water intake from 2 glasses per day to 5 glasses per day.        This is a list of the screening recommended for you and due dates:  Health Maintenance  Topic Date Due  . Stool Blood Test  02/27/2017*  . Mammogram  02/11/2016  . Tetanus Vaccine  02/29/2020  . Colon Cancer Screening  10/17/2024  . Flu Shot  Completed  . DEXA scan (bone density measurement)  Completed  . Shingles Vaccine  Completed  .  Hepatitis C: One time screening is recommended by Center for Disease Control  (CDC) for  adults born from [birth year] through 1964.   Completed  . Pneumonia vaccines  Completed  *Topic was postponed. The date shown is not the original due date.  Preventive Care for Adults  A healthy lifestyle and preventive care can promote health and wellness. Preventive health guidelines for adults include the following key practices.  . A routine yearly physical is a good way to check with your health care provider about your health and preventive screening. It is a chance to share any concerns and updates on your health and to receive a thorough exam.  . Visit your dentist for a routine exam and preventive care every 6 months. Brush your teeth twice a day and floss once a day. Good oral hygiene prevents tooth decay and gum disease.  . The frequency of eye exams is based on your age, health, family medical history, use  of contact lenses, and other factors. Follow your health care provider's ecommendations for frequency of eye exams.  . Eat a healthy diet. Foods like vegetables, fruits, whole grains, low-fat dairy  products, and lean protein foods contain the nutrients you need without too many calories. Decrease your intake of foods high in solid fats, added sugars, and salt. Eat the right amount of calories for you. Get information about a proper diet from your health care provider, if necessary.  . Regular physical exercise is one of the most important things you can do for your health. Most adults should get at least 150 minutes of moderate-intensity exercise (any activity that increases your heart rate and causes you to sweat) each week. In addition, most adults need muscle-strengthening exercises on 2 or more days a week.  Silver Sneakers may be a benefit available to you. To determine eligibility, you may visit the website: www.silversneakers.com or contact program at 765-015-6828 Mon-Fri between 8AM-8PM.   . Maintain a healthy weight. The body mass index (BMI) is a screening tool to identify possible weight problems. It provides an estimate of body fat based on height and weight. Your health care provider can find your BMI and can help you achieve or maintain a healthy weight.   For adults 20 years and older: ? A BMI below 18.5 is considered underweight. ? A BMI of 18.5 to 24.9 is normal. ? A BMI of 25 to 29.9 is considered overweight. ? A BMI of 30 and above is considered obese.   Marland Kitchen  Maintain normal blood lipids and cholesterol levels by exercising and minimizing your intake of saturated fat. Eat a balanced diet with plenty of fruit and vegetables. Blood tests for lipids and cholesterol should begin at age 6 and be repeated every 5 years. If your lipid or cholesterol levels are high, you are over 50, or you are at high risk for heart disease, you may need your cholesterol levels checked more frequently. Ongoing high lipid and cholesterol levels should be treated with medicines if diet and exercise are not working.  . If you smoke, find out from your health care provider how to quit. If you do not  use tobacco, please do not start.  . If you choose to drink alcohol, please do not consume more than 2 drinks per day. One drink is considered to be 12 ounces (355 mL) of beer, 5 ounces (148 mL) of wine, or 1.5 ounces (44 mL) of liquor.  . If you are 59-11 years old, ask your health care provider if you should take aspirin to prevent strokes.  . Use sunscreen. Apply sunscreen liberally and repeatedly throughout the day. You should seek shade when your shadow is shorter than you. Protect yourself by wearing long sleeves, pants, a wide-brimmed hat, and sunglasses year round, whenever you are outdoors.  . Once a month, do a whole body skin exam, using a mirror to look at the skin on your back. Tell your health care provider of new moles, moles that have irregular borders, moles that are larger than a pencil eraser, or moles that have changed in shape or color.

## 2016-02-01 NOTE — Progress Notes (Signed)
Location:  [facility] clinic Provider:  Constantine Ruddick L. Mariea Clonts, D.O., C.M.D.  Code Status: DNR Goals of Care:  Advanced Directives 02/01/2016  Does Patient Have a Medical Advance Directive? No  Type of Advance Directive -  Does patient want to make changes to medical advance directive? -  Copy of [place] in Chart? -  Would patient like information on creating a medical advance directive? -   Chief Complaint  Patient presents with  . Medical Management of Chronic Issues    4 month medication management    HPI: Patient is a 70 y.o. female seen today for medical management of chronic diseases.     Past Medical History:  Diagnosis Date  . Arthritis   . Depression   . Generalized headaches   . Hyperlipemia   . Hypertension     Past Surgical History:  Procedure Laterality Date  . ABDOMINAL HYSTERECTOMY  1980  . ANKLE FRACTURE SURGERY  2011   Left   . APPENDECTOMY  1980    Allergies  Allergen Reactions  . Latex Rash  . Powders [Starch] Rash    Talcum powder, has corn starch allergy  . Sulfur     Unable to recall type pf reaction   . Nickel       Medication List       Accurate as of 02/01/16  1:33 PM. Always use your most recent med list.          aspirin EC 81 MG tablet Take 1 tablet (81 mg total) by mouth daily.   atorvastatin 80 MG tablet Commonly known as:  LIPITOR Take 1 tablet (80 mg total) by mouth daily. For cholesterol   citalopram 10 MG tablet Commonly known as:  CELEXA Take 1 tablet (10 mg total) by mouth daily.   clonazePAM 1 MG tablet Commonly known as:  KLONOPIN Take 1 tablet (1 mg total) by mouth at bedtime.   FLUoxetine 10 MG capsule Commonly known as:  PROZAC Take 1 capsule (10 mg total) by mouth daily.   hydrochlorothiazide 12.5 MG tablet Commonly known as:  HYDRODIURIL Take 1 tablet (12.5 mg total) by mouth daily.   lamoTRIgine 25 MG tablet Commonly known as:  LAMICTAL Take 1 tablet (25 mg total) by mouth daily.     mirabegron ER 25 MG Tb24 tablet Commonly known as:  MYRBETRIQ Take 1 tablet (25 mg total) by mouth daily.   nebivolol 5 MG tablet Commonly known as:  BYSTOLIC Take 1 tablet (5 mg total) by mouth daily.   triamcinolone cream 0.1 % Commonly known as:  KENALOG   valsartan 320 MG tablet Commonly known as:  DIOVAN Take 1 tablet (320 mg total) by mouth daily.   Vitamin D3 2000 units capsule Take 1 capsule (2,000 Units total) by mouth daily.       Review of Systems:  Review of Systems  Constitutional: Negative for chills, fever and malaise/fatigue.  HENT: Negative for congestion and hearing loss.   Eyes: Negative for blurred vision.  Respiratory: Negative for cough and shortness of breath.   Cardiovascular: Negative for chest pain, palpitations and leg swelling.  Gastrointestinal: Negative for abdominal pain, blood in stool, constipation and melena.  Genitourinary: Negative for dysuria.  Musculoskeletal: Negative for falls and joint pain.  Skin: Negative for itching and rash.       Rash has finally cleared up  Neurological: Negative for dizziness, loss of consciousness and weakness.  Endo/Heme/Allergies: Does not bruise/bleed easily.  Psychiatric/Behavioral: Negative  for memory loss. The patient has insomnia.        Bipolar and has been off wellbutrin    Health Maintenance  Topic Date Due  . COLON CANCER SCREENING ANNUAL FOBT  02/27/2017 (Originally 10/21/2015)  . MAMMOGRAM  02/11/2016  . TETANUS/TDAP  02/29/2020  . COLONOSCOPY  10/17/2024  . INFLUENZA VACCINE  Completed  . DEXA SCAN  Completed  . ZOSTAVAX  Completed  . Hepatitis C Screening  Completed  . PNA vac Low Risk Adult  Completed    Physical Exam: Vitals:   02/01/16 1315  BP: (!) 172/98  Pulse: 73  Temp: 98.1 F (36.7 C)  TempSrc: Oral  SpO2: 97%  Weight: 169 lb (76.7 kg)  Height: 5\' 2"  (1.575 m)   Body mass index is 30.91 kg/m. Physical Exam  Constitutional: She is oriented to person, place, and  time. She appears well-developed and well-nourished. No distress.  HENT:  Head: Normocephalic and atraumatic.  Eyes: EOM are normal. Pupils are equal, round, and reactive to light.  Neck: Neck supple. No JVD present.  Cardiovascular: Normal rate, regular rhythm, normal heart sounds and intact distal pulses.   Pulmonary/Chest: Effort normal and breath sounds normal.  Abdominal: Soft. Bowel sounds are normal.  Musculoskeletal: Normal range of motion.  Lymphadenopathy:    She has no cervical adenopathy.  Neurological: She is alert and oriented to person, place, and time.  Skin: Skin is warm and dry. Capillary refill takes less than 2 seconds.  Slight residual hyperpigmentation of arms and legs (from prior extensive rash)  Psychiatric: She has a normal mood and affect.    Labs reviewed: Basic Metabolic Panel:  Recent Labs  02/06/15 1009  NA 139  K 4.1  CL 100  CO2 23  GLUCOSE 98  BUN 17  CREATININE 0.76  CALCIUM 9.8   Liver Function Tests: No results for input(s): AST, ALT, ALKPHOS, BILITOT, PROT, ALBUMIN in the last 8760 hours. No results for input(s): LIPASE, AMYLASE in the last 8760 hours. No results for input(s): AMMONIA in the last 8760 hours. CBC: No results for input(s): WBC, NEUTROABS, HGB, HCT, MCV, PLT in the last 8760 hours. Lipid Panel:  Recent Labs  02/06/15 1009 09/18/15 0840 12/21/15 0904  CHOL 183 158 175  HDL 53 55 48  LDLCALC 83 83 93  TRIG 234* 102 171*  CHOLHDL 3.5 2.9 3.6   Lab Results  Component Value Date   HGBA1C 5.6 12/21/2015   Assessment/Plan Annual exam: Received her pneumovax, flu shot at [facility] earlier today She still needs to schedule her mammo and bone density  1. Essential hypertension, benign -bp well controlled when she takes her medication (did not this am and has also been smoking) - cont current regimen, but take meds before appts so we can tell if they are working - CBC with Differential/Platelet; Future - COMPLETE  METABOLIC PANEL WITH GFR; Future  2. Hyperglycemia - cont to work on diet and exercise, f/u labs before next visit  - Hemoglobin A1c; Future  3. Tobacco abuse -ongoing--has not reduced her number of cigarettes since last visit, but also has been off her wellbutrin b/c she ran out  4. Hyperlipidemia, unspecified hyperlipidemia type -f/u lipid panel before next visit -cont lipitor 80mg   5. Bipolar 1 disorder ([place]) -ongoing, continues on lamictal, prozac, clonazepam and resuemd her wellbutrin which she'd run out of - buPROPion (WELLBUTRIN XL) 300 MG 24 hr tablet; Take 1 tablet (300 mg total) by mouth daily.  Dispense: 30  tablet; Refill: 3  6. Insomnia, unspecified type -wants to use melatonin along with clonazepam  7. DNR (do not resuscitate) discussion -DNR form completed today for pt who requested this code status--says if she has an arrest that means it's time for her to go and she does not want to be kept alive on machines  Labs/tests ordered:   Orders Placed This Encounter  Procedures  . Hemoglobin A1c    Standing Status:   Future    Standing Expiration Date:   10/01/2016  . CBC with Differential/Platelet    Standing Status:   Future    Standing Expiration Date:   10/01/2016  . COMPLETE METABOLIC PANEL WITH GFR    [facility]    Standing Status:   Future    Standing Expiration Date:   10/01/2016  . Lipid panel    Standing Status:   Future    Standing Expiration Date:   10/01/2016    Order Specific Question:   Has the patient fasted?    Answer:   Yes    Next appt:  06/02/2016 med mgt, labs before   [place] Caprice Wasko, D.O. [facility] [address], [place] [address] Cell Phone (Mon-Fri 8am-5pm):  [phone] On Call:  [phone] & follow prompts after 5pm & weekends Office Phone:  [phone] Office Fax:  [phone]

## 2016-02-01 NOTE — Progress Notes (Signed)
Quick Notes   Health Maintenance:   Pt received Flu and Pn23 today. UTD on all other.    Abnormal Screen: None; MMSE-27/30 Passed clock test    Patient Concerns:  Pt stopped taking Wellbutrin b/c she ran out and did not call to get more. States it was helping and she would like to re-start. She also asked about Dental Assistance b/c her insurance does not cover much. Told her I would refer her to the [facility] and someone will call her in the next week or 2. Pt also needs to have an eye exam but does not know where to go. Has cataracts.     Nurse Concerns:  BP elevated today, 172/98. States she did not take her meds this Am. Also, see above.

## 2016-02-01 NOTE — Progress Notes (Signed)
Subjective:   Brittney Lang is a 70 y.o. female who presents for Medicare Annual (Subsequent) preventive examination.  Review of Systems:  Cardiac Risk Factors include: advanced age (>73men, >10 women);smoking/ tobacco exposure;family history of premature cardiovascular disease;hypertension     Objective:     Vitals: BP (!) 172/98 (BP Location: Right Arm, Patient Position: Sitting, Cuff Size: Normal)   Pulse 73   Temp 98.1 F (36.7 C) (Oral)   Ht 5' 1.5" (1.562 m)   Wt 169 lb 6.4 oz (76.8 kg)   SpO2 97%   BMI 31.49 kg/m   Body mass index is 31.49 kg/m.   Tobacco History  Smoking Status  . Current Every Day Smoker  . Packs/day: 0.50  . Years: 30.00  . Types: Cigarettes  Smokeless Tobacco  . Never Used     Ready to quit: No Counseling given: No   Past Medical History:  Diagnosis Date  . Arthritis   . Depression   . Generalized headaches   . Hyperlipemia   . Hypertension    Past Surgical History:  Procedure Laterality Date  . ABDOMINAL HYSTERECTOMY  1980  . ANKLE FRACTURE SURGERY  2011   Left   . APPENDECTOMY  1980   Family History  Problem Relation Age of Onset  . Cancer Mother   . Heart disease Mother   . Hypertension Mother   . Hyperlipidemia Mother   . Depression Sister   . Depression Sister   . Diabetes Other     Grand-daughter  . Kidney disease Son     Kidney Transplant   . Pancreatic disease Son     Pancrease Transplant    History  Sexual Activity  . Sexual activity: No    Outpatient Encounter Prescriptions as of 02/01/2016  Medication Sig  . aspirin EC 81 MG tablet Take 1 tablet (81 mg total) by mouth daily.  Marland Kitchen atorvastatin (LIPITOR) 80 MG tablet Take 1 tablet (80 mg total) by mouth daily. For cholesterol  . Cholecalciferol (VITAMIN D3) 2000 units capsule Take 1 capsule (2,000 Units total) by mouth daily.  . citalopram (CELEXA) 10 MG tablet Take 1 tablet (10 mg total) by mouth daily.  . clonazePAM (KLONOPIN) 1 MG tablet Take 1  tablet (1 mg total) by mouth at bedtime.  Marland Kitchen FLUoxetine (PROZAC) 10 MG capsule Take 1 capsule (10 mg total) by mouth daily.  . hydrochlorothiazide (HYDRODIURIL) 12.5 MG tablet Take 1 tablet (12.5 mg total) by mouth daily.  Marland Kitchen lamoTRIgine (LAMICTAL) 25 MG tablet Take 1 tablet (25 mg total) by mouth daily.  . mirabegron ER (MYRBETRIQ) 25 MG TB24 tablet Take 1 tablet (25 mg total) by mouth daily.  . nebivolol (BYSTOLIC) 5 MG tablet Take 1 tablet (5 mg total) by mouth daily.  Marland Kitchen triamcinolone cream (KENALOG) 0.1 %   . valsartan (DIOVAN) 320 MG tablet Take 1 tablet (320 mg total) by mouth daily.  . [DISCONTINUED] buPROPion (WELLBUTRIN XL) 300 MG 24 hr tablet Take 1 tablet (300 mg total) by mouth daily. (Patient not taking: Reported on 02/01/2016)  . [DISCONTINUED] predniSONE (DELTASONE) 10 MG tablet 6 for 2 days, 5 for 2 days, 4 for 2 days, 3 for 2 days, 2 for 2 days, 1 for 2 days, 1/2 for 2 days   No facility-administered encounter medications on file as of 02/01/2016.     Activities of Daily Living In your present state of health, do you have any difficulty performing the following activities: 02/01/2016 05/08/2015  Hearing?  Y N  Vision? Y N  Difficulty concentrating or making decisions? Y N  Walking or climbing stairs? N N  Dressing or bathing? N N  Doing errands, shopping? Y N  Preparing Food and eating ? N -  Using the Toilet? N -  In the past six months, have you accidently leaked urine? N -  Do you have problems with loss of bowel control? N -  Managing your Medications? N -  Managing your Finances? N -  Housekeeping or managing your Housekeeping? N -  Some recent data might be hidden    Patient Care Team: Gayland Curry, DO as PCP - General (Geriatric Medicine) Norma Fredrickson, MD as Consulting Physician (Psychiatry) Harriett Sine, MD as Consulting Physician (Dermatology)    Assessment:    Exercise Activities and Dietary recommendations Current Exercise Habits: Home exercise  routine, Type of exercise: walking, Time (Minutes): 15, Frequency (Times/Week): 7, Weekly Exercise (Minutes/Week): 105, Intensity: Mild  Goals    . Quit smoking / using tobacco    . water intake          Starting 02/01/16, I will attempt to increase my water intake from 2 glasses per day to 5 glasses per day.       Fall Risk Fall Risk  02/01/2016 09/18/2015 03/05/2015 02/26/2015 02/17/2015  Falls in the past year? No No No No No   Depression Screen PHQ 2/9 Scores 02/01/2016 09/18/2015 02/06/2015 12/28/2012  PHQ - 2 Score 1 0 3 4  PHQ- 9 Score - - 9 -     Cognitive Function MMSE - Mini Mental State Exam 02/01/2016 02/06/2015  Orientation to time 5 5  Orientation to Place 5 5  Registration 3 3  Attention/ Calculation 3 1  Attention/Calculation-comments - one subtraction correct, patient couldn't spell backwards  Recall 2 2  Language- name 2 objects 2 2  Language- repeat 1 1  Language- follow 3 step command 3 2  Language- read & follow direction 1 1  Write a sentence 1 1  Copy design 1 1  Total score 27 24        Immunization History  Administered Date(s) Administered  . Influenza,inj,Quad PF,36+ Mos 12/28/2012, 02/06/2015, 02/01/2016  . Influenza-Unspecified 01/16/2014  . Pneumococcal Conjugate-13 09/09/2013, 07/01/2014  . Pneumococcal Polysaccharide-23 02/01/2016  . Tdap 02/28/2010  . Zoster 06/29/2014   Screening Tests Health Maintenance  Topic Date Due  . COLON CANCER SCREENING ANNUAL FOBT  02/27/2017 (Originally 10/21/2015)  . MAMMOGRAM  02/11/2016  . TETANUS/TDAP  02/29/2020  . COLONOSCOPY  10/17/2024  . INFLUENZA VACCINE  Completed  . DEXA SCAN  Completed  . ZOSTAVAX  Completed  . Hepatitis C Screening  Completed  . PNA vac Low Risk Adult  Completed      Plan:    I have personally reviewed and addressed the Medicare Annual Wellness questionnaire and have noted the following in the patient's chart:  A. Medical and social history B. Use of alcohol, tobacco or  illicit drugs  C. Current medications and supplements D. Functional ability and status E.  Nutritional status F.  Physical activity G. Advance directives H. List of other physicians I.  Hospitalizations, surgeries, and ER visits in previous 12 months J.  [place] to include hearing, vision, cognitive, depression L. Referrals and appointments - none  In addition, I have reviewed and discussed with patient certain preventive protocols, quality metrics, and best practice recommendations. A written personalized care plan for preventive services as well as  general preventive health recommendations were provided to patient.  See attached scanned questionnaire for additional information.   Signed,   Allyn Kenner, LPN Health Advisor    I reviewed health advisor's note, was available for consultation and agree with the assessment and plan as written.  Reminded pt to schedule her mammogram and bone density also.  She will do this.  Discussed smoking cessation--not ready.    Hideo Googe L. Kamir Selover, D.O. [facility] [address], [place] [address] Cell Phone (Mon-Fri 8am-5pm):  [phone] On Call:  [phone] & follow prompts after 5pm & weekends Office Phone:  [phone] Office Fax:  [phone]

## 2016-02-12 ENCOUNTER — Other Ambulatory Visit: Payer: Self-pay | Admitting: Pharmacist

## 2016-02-12 NOTE — Patient Outreach (Signed)
Outreach call to [employer] regarding her request for follow up from the [facility] Medication Adherence Campaign. Called and spoke with patient. HIPAA identifiers verified and verbal consent received.  Ms. Hern reports that she takes her atorvastatin daily as directed. Reports that she currently has plenty of this medication at home. Reports that she misses a dose of the medication maybe once a month. Counsel patient on the importance of medication adherence. Ms. Kolo reports that it is sometimes hard for her to afford all of her medications at once, but that she is able to do it. Note that per the Medicare.gov website, the patient has full extra help with the cost of her medications. Let Ms. Donohoe know about potential further savings and convenience of using [facility]'s mail order pharmacy. Patient thanks me for this information.  Ms. Fahrney reports that she has no further medication questions/concerns at this time.  Harlow Asa, PharmD, [facility] [phone]

## 2016-05-31 ENCOUNTER — Other Ambulatory Visit: Payer: Medicare Other

## 2016-06-02 ENCOUNTER — Encounter (INDEPENDENT_AMBULATORY_CARE_PROVIDER_SITE_OTHER): Payer: Self-pay

## 2016-06-02 ENCOUNTER — Ambulatory Visit (INDEPENDENT_AMBULATORY_CARE_PROVIDER_SITE_OTHER): Payer: Medicare Other | Admitting: Internal Medicine

## 2016-06-02 ENCOUNTER — Encounter: Payer: Self-pay | Admitting: Internal Medicine

## 2016-06-02 VITALS — BP 132/92 | HR 75 | Temp 98.4°F | Resp 16 | Ht 62.0 in | Wt 169.4 lb

## 2016-06-02 DIAGNOSIS — Z1239 Encounter for other screening for malignant neoplasm of breast: Secondary | ICD-10-CM

## 2016-06-02 DIAGNOSIS — E2839 Other primary ovarian failure: Secondary | ICD-10-CM | POA: Diagnosis not present

## 2016-06-02 DIAGNOSIS — M170 Bilateral primary osteoarthritis of knee: Secondary | ICD-10-CM

## 2016-06-02 DIAGNOSIS — F99 Mental disorder, not otherwise specified: Secondary | ICD-10-CM | POA: Diagnosis not present

## 2016-06-02 DIAGNOSIS — N3281 Overactive bladder: Secondary | ICD-10-CM

## 2016-06-02 DIAGNOSIS — I10 Essential (primary) hypertension: Secondary | ICD-10-CM | POA: Diagnosis not present

## 2016-06-02 DIAGNOSIS — R739 Hyperglycemia, unspecified: Secondary | ICD-10-CM

## 2016-06-02 DIAGNOSIS — Z1231 Encounter for screening mammogram for malignant neoplasm of breast: Secondary | ICD-10-CM

## 2016-06-02 DIAGNOSIS — F5105 Insomnia due to other mental disorder: Secondary | ICD-10-CM

## 2016-06-02 DIAGNOSIS — E785 Hyperlipidemia, unspecified: Secondary | ICD-10-CM

## 2016-06-02 DIAGNOSIS — F319 Bipolar disorder, unspecified: Secondary | ICD-10-CM

## 2016-06-02 MED ORDER — NEBIVOLOL HCL 10 MG PO TABS: 10.0000 mg | ORAL_TABLET | Freq: Every day | ORAL | 3 refills | Status: DC

## 2016-06-02 MED ORDER — CLONAZEPAM 2 MG PO TABS: 2.0000 mg | ORAL_TABLET | Freq: Every day | ORAL | 3 refills | Status: DC

## 2016-06-02 NOTE — Progress Notes (Signed)
Location:  [facility] clinic Provider:  Donis Pinder L. Mariea Clonts, D.O., C.M.D.  Code Status: DNR Goals of Care:  Advanced Directives 06/02/2016  Does Patient Have a Medical Advance Directive? Yes  Type of Paramedic of [place];Living will;Out of facility DNR (pink MOST or yellow form)  Does patient want to make changes to medical advance directive? -  Copy of [place] in Chart? Yes  Would patient like information on creating a medical advance directive? -  Pre-existing out of facility DNR order (yellow form or pink MOST form) Yellow form placed in chart (order not valid for inpatient use)   Chief Complaint  Patient presents with  . Medical Management of Chronic Issues    18mth follow-up    HPI: Patient is a 71 y.o. female seen today for medical management of chronic diseases.    Colon cancer screening:  Pt did cologuard in 2016 which was negative.  Next due in 2019.    HTN:  BP elevated.  Had a cigarette before 9:30am.  Needs bp med change--has been high several visits with current regimen.  Agrees to change.  Rash better now.    OAB/urge incontinence:  Taking myrbetriq--is out at present.  Was working well.  Wonders if water pill can be stopped.  OA knees:  Knees bothersome on stairs.  Almost gave out on her on the steps.  Knows replacements needed, but putting off as long as she can.  Past Medical History:  Diagnosis Date  . Arthritis   . Depression   . Generalized headaches   . Hyperlipemia   . Hypertension     Past Surgical History:  Procedure Laterality Date  . ABDOMINAL HYSTERECTOMY  1980  . ANKLE FRACTURE SURGERY  2011   Left   . APPENDECTOMY  1980    Allergies  Allergen Reactions  . Latex Rash  . Powders [Starch] Rash    Talcum powder, has corn starch allergy  . Sulfur     Unable to recall type pf reaction   . Nickel     Allergies as of 06/02/2016      Reactions   Latex Rash   Powders [starch] Rash   Talcum powder, has  corn starch allergy   Sulfur    Unable to recall type pf reaction    Nickel       Medication List       Accurate as of 06/02/16 12:25 PM. Always use your most recent med list.          aspirin EC 81 MG tablet Take 1 tablet (81 mg total) by mouth daily.   atorvastatin 80 MG tablet Commonly known as:  LIPITOR Take 1 tablet (80 mg total) by mouth daily. For cholesterol   buPROPion 300 MG 24 hr tablet Commonly known as:  WELLBUTRIN XL Take 1 tablet (300 mg total) by mouth daily.   citalopram 10 MG tablet Commonly known as:  CELEXA Take 1 tablet (10 mg total) by mouth daily.   clonazePAM 2 MG tablet Commonly known as:  KLONOPIN Take 1 tablet (2 mg total) by mouth at bedtime.   FLUoxetine 10 MG capsule Commonly known as:  PROZAC Take 1 capsule (10 mg total) by mouth daily.   lamoTRIgine 25 MG tablet Commonly known as:  LAMICTAL Take 1 tablet (25 mg total) by mouth daily.   mirabegron ER 25 MG Tb24 tablet Commonly known as:  MYRBETRIQ Take 1 tablet (25 mg total) by mouth daily.  nebivolol 10 MG tablet Commonly known as:  BYSTOLIC Take 1 tablet (10 mg total) by mouth daily.   triamcinolone cream 0.1 % Commonly known as:  KENALOG   valsartan 320 MG tablet Commonly known as:  DIOVAN Take 1 tablet (320 mg total) by mouth daily.   Vitamin D3 2000 units capsule Take 1 capsule (2,000 Units total) by mouth daily.       Review of Systems:  Review of Systems  Constitutional: Negative for chills, fever and malaise/fatigue.  HENT: Negative for congestion and hearing loss.   Eyes: Negative for blurred vision.  Respiratory: Negative for cough and shortness of breath.   Cardiovascular: Negative for chest pain, palpitations and leg swelling.  Gastrointestinal: Negative for abdominal pain, blood in stool, constipation, diarrhea and melena.  Genitourinary: Positive for urgency. Negative for dysuria, flank pain, frequency and hematuria.  Musculoskeletal: Positive for joint  pain. Negative for falls.  Skin: Negative for itching and rash.  Neurological: Negative for dizziness, loss of consciousness and weakness.  Psychiatric/Behavioral: Positive for depression. Negative for hallucinations, memory loss and substance abuse. The patient is nervous/anxious and has insomnia.        Tobacco abuse    Health Maintenance  Topic Date Due  . COLON CANCER SCREENING ANNUAL FOBT  10/21/2015  . MAMMOGRAM  02/11/2016  . INFLUENZA VACCINE  09/28/2016  . TETANUS/TDAP  02/29/2020  . DEXA SCAN  Completed  . Hepatitis C Screening  Completed  . PNA vac Low Risk Adult  Completed    Physical Exam: Vitals:   06/02/16 1023 06/02/16 1119  BP: (!) 178/98 (!) 132/92  Pulse: 75   Resp: 16   Temp: 98.4 F (36.9 C)   TempSrc: Oral   SpO2: 97%   Weight: 169 lb 6.4 oz (76.8 kg)   Height: 5\' 2"  (1.575 m)    Body mass index is 30.98 kg/m. Physical Exam  Constitutional: She is oriented to person, place, and time. She appears well-developed and well-nourished. No distress.  Cardiovascular: Normal rate, regular rhythm, normal heart sounds and intact distal pulses.   Pulmonary/Chest: Effort normal and breath sounds normal. No respiratory distress.  Abdominal: Soft. Bowel sounds are normal.  Musculoskeletal: Normal range of motion.  Neurological: She is alert and oriented to person, place, and time.  Skin: Skin is warm and dry. Capillary refill takes less than 2 seconds.  Psychiatric: She has a normal mood and affect.    Labs reviewed: Basic Metabolic Panel: No results for input(s): NA, K, CL, CO2, GLUCOSE, BUN, CREATININE, CALCIUM, MG, PHOS, TSH in the last 8760 hours. Liver Function Tests: No results for input(s): AST, ALT, ALKPHOS, BILITOT, PROT, ALBUMIN in the last 8760 hours. No results for input(s): LIPASE, AMYLASE in the last 8760 hours. No results for input(s): AMMONIA in the last 8760 hours. CBC: No results for input(s): WBC, NEUTROABS, HGB, HCT, MCV, PLT in the last  8760 hours. Lipid Panel:  Recent Labs  09/18/15 0840 12/21/15 0904  CHOL 158 175  HDL 55 48  LDLCALC 83 93  TRIG 102 171*  CHOLHDL 2.9 3.6   Lab Results  Component Value Date   HGBA1C 5.6 12/21/2015   cologuard negative 2016.  Recheck 2019  Assessment/Plan 1. Essential hypertension, benign -stop hctz due to urge incontinence, increase bystolic from 5mg  to 10mg  daily -f/u bp next visit and incontinence situation - nebivolol (BYSTOLIC) 10 MG tablet; Take 1 tablet (10 mg total) by mouth daily.  Dispense: 90 tablet; Refill: 3 -also continue  diovan 320mg  daily  2. OAB (overactive bladder) -cont myrbetriq therapy, but if stopping hctz solves OAB/urge incontinence, may then d/c this also  3. Insomnia due to other mental disorder - clonazePAM (KLONOPIN) 2 MG tablet; Take 1 tablet (2 mg total) by mouth at bedtime.  Dispense: 90 tablet; Refill: 3  4. Primary osteoarthritis of both knees -ongoing, not wanting surgery, cont prn tylenol, otc topicals as needed, encourage walking for exercise  5. Bipolar 1 disorder ([place]) -ongoing, continues on wellbutrin, lamictal, klonopin (taking two at night so increase to 2mg  tablet), fluoxetine, citalopram--need to verify she is really on all of these--would not advise prozac and celexa together for risk of serotonin syndrome  6. Hyperglycemia -needs /fu hba1c also  7. Hyperlipidemia, unspecified hyperlipidemia type -cont atorvastatin, needs f/u labs but missed appt and now there is question about insurance coverage today so will put off to avoid high charges to pt  8. Screening for breast cancer - MM DIGITAL SCREENING BILATERAL; Future--pt says she will schedule today  9. Estrogen deficiency - DG Bone Density; Future--pt says she will schedule today   Labs/tests ordered:   Orders Placed This Encounter  Procedures  . DG Bone Density    Standing Status:   Future    Standing Expiration Date:   08/02/2017    Order Specific Question:    Reason for Exam (SYMPTOM  OR DIAGNOSIS REQUIRED)    Answer:   postmenopausal, estrogen deficient    Order Specific Question:   Preferred imaging location?    Answer:   [facility]  . MM DIGITAL SCREENING BILATERAL    Standing Status:   Future    Standing Expiration Date:   08/02/2017    Order Specific Question:   Reason for exam:    Answer:   routine breast cancer screening    Order Specific Question:   Preferred imaging location?    Answer:   [facility]  if labs she had expire, will need hba1c, flp, cmp with gfr, cbc with diff when comes in Next appt:  09/01/2016 pending pt checking with subdivision of [employer] insurance--wants to stay here  Dante Roudebush L. Kidus Delman, D.O. [facility] [address], [place] [address] Cell Phone (Mon-Fri 8am-5pm):  [phone] On Call:  [phone] & follow prompts after 5pm & weekends Office Phone:  [phone] Office Fax:  [phone]

## 2016-06-09 ENCOUNTER — Other Ambulatory Visit: Payer: Self-pay | Admitting: Internal Medicine

## 2016-06-27 ENCOUNTER — Other Ambulatory Visit: Payer: Self-pay | Admitting: Internal Medicine

## 2016-07-01 ENCOUNTER — Other Ambulatory Visit: Payer: Self-pay | Admitting: *Deleted

## 2016-07-01 DIAGNOSIS — F99 Mental disorder, not otherwise specified: Principal | ICD-10-CM

## 2016-07-01 DIAGNOSIS — F5105 Insomnia due to other mental disorder: Secondary | ICD-10-CM

## 2016-07-01 MED ORDER — CLONAZEPAM 2 MG PO TABS: 2.0000 mg | ORAL_TABLET | Freq: Every day | ORAL | 1 refills | Status: AC

## 2016-07-01 NOTE — Telephone Encounter (Signed)
Patient requested Refill to be sent to pharmacy. Patient stated that her insurance is straighten out now and when she receives her new card she will bring to our office for her chart.

## 2016-07-20 ENCOUNTER — Encounter: Payer: Self-pay | Admitting: Internal Medicine

## 2016-08-01 ENCOUNTER — Encounter: Payer: Self-pay | Admitting: Nurse Practitioner

## 2016-08-01 ENCOUNTER — Ambulatory Visit (INDEPENDENT_AMBULATORY_CARE_PROVIDER_SITE_OTHER): Payer: Medicare Other | Admitting: Nurse Practitioner

## 2016-08-01 VITALS — BP 178/88 | HR 75 | Temp 98.3°F | Resp 19 | Ht 62.0 in | Wt 172.0 lb

## 2016-08-01 DIAGNOSIS — N3281 Overactive bladder: Secondary | ICD-10-CM

## 2016-08-01 DIAGNOSIS — R040 Epistaxis: Secondary | ICD-10-CM

## 2016-08-01 DIAGNOSIS — F319 Bipolar disorder, unspecified: Secondary | ICD-10-CM

## 2016-08-01 DIAGNOSIS — Z720 Tobacco use: Secondary | ICD-10-CM | POA: Diagnosis not present

## 2016-08-01 DIAGNOSIS — I10 Essential (primary) hypertension: Secondary | ICD-10-CM | POA: Diagnosis not present

## 2016-08-01 LAB — CBC WITH DIFFERENTIAL/PLATELET
Basophils Absolute: 0 cells/uL (ref 0–200)
Basophils Relative: 0 %
Eosinophils Absolute: 158 cells/uL (ref 15–500)
Eosinophils Relative: 2 %
HCT: 41.2 % (ref 35.0–45.0)
Hemoglobin: 13.2 g/dL (ref 11.7–15.5)
Lymphocytes Relative: 28 %
Lymphs Abs: 2212 cells/uL (ref 850–3900)
MCH: 29.3 pg (ref 27.0–33.0)
MCHC: 32 g/dL (ref 32.0–36.0)
MCV: 91.4 fL (ref 80.0–100.0)
MPV: 9.8 fL (ref 7.5–12.5)
Monocytes Absolute: 553 cells/uL (ref 200–950)
Monocytes Relative: 7 %
Neutro Abs: 4977 cells/uL (ref 1500–7800)
Neutrophils Relative %: 63 %
Platelets: 314 10*3/uL (ref 140–400)
RBC: 4.51 MIL/uL (ref 3.80–5.10)
RDW: 14 % (ref 11.0–15.0)
WBC: 7.9 10*3/uL (ref 3.8–10.8)

## 2016-08-01 MED ORDER — BUPROPION HCL ER (XL) 300 MG PO TB24: 300.0000 mg | ORAL_TABLET | Freq: Every day | ORAL | 3 refills | Status: AC

## 2016-08-01 MED ORDER — NEBIVOLOL HCL 20 MG PO TABS: 20.0000 mg | ORAL_TABLET | Freq: Every day | ORAL | 3 refills | Status: AC

## 2016-08-01 NOTE — Progress Notes (Signed)
Careteam: Patient Care Team: Gayland Curry, DO as PCP - General (Geriatric Medicine) Norma Fredrickson, MD as Consulting Physician (Psychiatry) Harriett Sine, MD as Consulting Physician (Dermatology)  Advanced Directive information Does Patient Have a Medical Advance Directive?: Yes, Type of Advance Directive: Healthcare Power of Attorney  Allergies  Allergen Reactions  . Latex Rash  . Powders [Starch] Rash    Talcum powder, has corn starch allergy  . Sulfur     Unable to recall type pf reaction   . Nickel     Chief Complaint  Patient presents with  . Acute Visit    Pt is being seen for high BP (170/76) Friday morning, 3 nosebleed 3-4 times with BP 170 to 180 /76-80.      HPI: Patient is a 71 y.o. female seen in the office today due to elevated blood pressure and nose bleeds.  Nose bleeds started 07/28/16 lasted about 3 mins. Last episode was yesterday.  Checking blood pressure after she smokes cigarettes.  Smoked before she came in for appt.  Took 2 bystolics and blood pressure came down to 128/70s Has conts to take HCTZ because she had some left but only has a few left at home  Was seeing psych who placed her on prozac, celexa, wellbutrin, lamictal and clonopin.  Has had bipolar 1 for over 17-18 years.  Not seeing psychiatry now.  Has not been taking her prozac because she ran out of it but notices worsen mood while she is not on it. Has been off this medication for 4-5 months.   Overactive bladder- ran out of myrbetriq so having problems with OAB.  Review of Systems:  Review of Systems  Constitutional: Negative for chills, fever and malaise/fatigue.  HENT: Positive for nosebleeds. Negative for congestion and hearing loss.   Eyes: Negative for blurred vision.  Respiratory: Negative for cough and shortness of breath.   Cardiovascular: Negative for chest pain, palpitations and leg swelling.  Gastrointestinal: Negative for abdominal pain, blood in stool,  constipation, diarrhea and melena.  Genitourinary: Positive for frequency and urgency. Negative for dysuria, flank pain and hematuria.  Musculoskeletal: Positive for joint pain. Negative for falls.  Skin: Negative for itching and rash.  Neurological: Negative for dizziness, loss of consciousness and weakness.  Psychiatric/Behavioral: Positive for depression. Negative for hallucinations, memory loss and substance abuse. The patient is nervous/anxious and has insomnia.        Tobacco abuse    Past Medical History:  Diagnosis Date  . Arthritis   . Depression   . Generalized headaches   . Hyperlipemia   . Hypertension    Past Surgical History:  Procedure Laterality Date  . ABDOMINAL HYSTERECTOMY  1980  . ANKLE FRACTURE SURGERY  2011   Left   . APPENDECTOMY  1980   Social History:   reports that she has been smoking Cigarettes.  She has a 15.00 pack-year smoking history. She has never used smokeless tobacco. She reports that she drinks alcohol. She reports that she does not use drugs.  Family History  Problem Relation Age of Onset  . Cancer Mother   . Heart disease Mother   . Hypertension Mother   . Hyperlipidemia Mother   . Depression Sister   . Depression Sister   . Diabetes Other        Grand-daughter  . Kidney disease Son        Kidney Transplant   . Pancreatic disease Son  Pancrease Transplant     Medications: Patient's Medications  New Prescriptions   No medications on file  Previous Medications   ASPIRIN EC 81 MG TABLET    Take 1 tablet (81 mg total) by mouth daily.   ATORVASTATIN (LIPITOR) 80 MG TABLET    Take 1 tablet (80 mg total) by mouth daily. For cholesterol   BUPROPION (WELLBUTRIN XL) 300 MG 24 HR TABLET    Take 1 tablet (300 mg total) by mouth daily.   CHOLECALCIFEROL (VITAMIN D3) 2000 UNITS CAPSULE    Take 1 capsule (2,000 Units total) by mouth daily.   CITALOPRAM (CELEXA) 10 MG TABLET    Take 1 tablet (10 mg total) by mouth daily.   CLONAZEPAM  (KLONOPIN) 2 MG TABLET    Take 1 tablet (2 mg total) by mouth at bedtime. For Rest   FLUOXETINE (PROZAC) 10 MG CAPSULE    Take 1 capsule (10 mg total) by mouth daily.   LAMOTRIGINE (LAMICTAL) 25 MG TABLET    Take 1 tablet (25 mg total) by mouth daily.   MIRABEGRON ER (MYRBETRIQ) 25 MG TB24 TABLET    Take 1 tablet (25 mg total) by mouth daily.   NEBIVOLOL (BYSTOLIC) 10 MG TABLET    Take 1 tablet (10 mg total) by mouth daily.   TRIAMCINOLONE CREAM (KENALOG) 0.1 %       VALSARTAN (DIOVAN) 320 MG TABLET    Take 1 tablet (320 mg total) by mouth daily.  Modified Medications   No medications on file  Discontinued Medications   No medications on file     Physical Exam:  Vitals:   08/01/16 1515  BP: (!) 178/88  Pulse: 75  Resp: 19  Temp: 98.3 F (36.8 C)  TempSrc: Oral  SpO2: 97%  Weight: 172 lb (78 kg)  Height: 5' 2"  (1.575 m)   Body mass index is 31.46 kg/m.  Physical Exam  Constitutional: She is oriented to person, place, and time. She appears well-developed and well-nourished. No distress.  Cardiovascular: Normal rate, regular rhythm and normal heart sounds.   Pulmonary/Chest: Effort normal and breath sounds normal. No respiratory distress.  Abdominal: Soft. Bowel sounds are normal.  Musculoskeletal: Normal range of motion.  Neurological: She is alert and oriented to person, place, and time.  Skin: Skin is warm and dry. Capillary refill takes less than 2 seconds.  Psychiatric: She has a normal mood and affect.    Labs reviewed: Basic Metabolic Panel: No results for input(s): NA, K, CL, CO2, GLUCOSE, BUN, CREATININE, CALCIUM, MG, PHOS, TSH in the last 8760 hours. Liver Function Tests: No results for input(s): AST, ALT, ALKPHOS, BILITOT, PROT, ALBUMIN in the last 8760 hours. No results for input(s): LIPASE, AMYLASE in the last 8760 hours. No results for input(s): AMMONIA in the last 8760 hours. CBC: No results for input(s): WBC, NEUTROABS, HGB, HCT, MCV, PLT in the last  8760 hours. Lipid Panel:  Recent Labs  09/18/15 0840 12/21/15 0904  CHOL 158 175  HDL 55 48  LDLCALC 83 93  TRIG 102 171*  CHOLHDL 2.9 3.6   TSH: No results for input(s): TSH in the last 8760 hours. A1C: Lab Results  Component Value Date   HGBA1C 5.6 12/21/2015     Assessment/Plan 1. Bipolar 1 disorder ([place]) Has not seen psych in years. Not taking prozac for several months. Reports some agitation and depression. She is currently on wellbutrin, lamictal, celexa and clonopin.  Consider increasing celexa and next OV - buPROPion [facility]  XL) 300 MG 24 hr tablet; Take 1 tablet (300 mg total) by mouth daily.  Dispense: 30 tablet; Refill: 3  2. Essential hypertension, benign Elevated blood pressure, improved to 158/90 on recheck. Will increase bystolic to 20 mg daily and to cont valsartan 320 mg daily  - CMP with eGFR - CBC with Differential/Platelets - Nebivolol HCl (BYSTOLIC) 20 MG TABS; Take 1 tablet (20 mg total) by mouth daily.  Dispense: 30 tablet; Refill: 3  3. OAB (overactive bladder) Pt to stop HCTZ, if symptoms cont will restart myrbetriq   4. Tobacco abuse -smoking cessation encouraged.   5. Epistaxis -to use plain saline to bilateral nares, not to blow nose.  - to go to ED if bleeding does not stop.  - CBC with Differential/Platelets  Follow up in 2 weeks on blood pressure, mood, and epistaxis   Akilah Cureton K. Harle Battiest  [facility] [phone] 8 am - 5 pm) [phone] (after hours)

## 2016-08-01 NOTE — Patient Instructions (Signed)
Use plain nasal saline nose to nares throughout the day to keep moist Do not blow nose (increase pressure) can cause bleeding  STOP HCTZ  Increase Bystolic to 20 mg daily for blood pressure Take blood pressure at least 1 hour after you have taken medication and not after you have had a cigarette   Follow up in 2 weeks.

## 2016-08-02 LAB — COMPLETE METABOLIC PANEL WITH GFR
ALBUMIN: 4.1 g/dL (ref 3.6–5.1)
ALK PHOS: 89 U/L (ref 33–130)
ALT: 13 U/L (ref 6–29)
AST: 13 U/L (ref 10–35)
BILIRUBIN TOTAL: 0.4 mg/dL (ref 0.2–1.2)
BUN: 13 mg/dL (ref 7–25)
CALCIUM: 9.5 mg/dL (ref 8.6–10.4)
CO2: 20 mmol/L (ref 20–31)
CREATININE: 0.83 mg/dL (ref 0.50–0.99)
Chloride: 109 mmol/L (ref 98–110)
GFR, EST AFRICAN AMERICAN: 83 mL/min (ref >=60)
GFR, Est Non African American: 72 mL/min (ref >=60)
Glucose, Bld: 73 mg/dL (ref 65–99)
Potassium: 3.3 mmol/L — ABNORMAL LOW (ref 3.5–5.3)
Sodium: 141 mmol/L (ref 135–146)
TOTAL PROTEIN: 6.7 g/dL (ref 6.1–8.1)

## 2016-08-16 ENCOUNTER — Ambulatory Visit (INDEPENDENT_AMBULATORY_CARE_PROVIDER_SITE_OTHER): Payer: Medicare Other | Admitting: Nurse Practitioner

## 2016-08-16 ENCOUNTER — Encounter: Payer: Self-pay | Admitting: Nurse Practitioner

## 2016-08-16 ENCOUNTER — Other Ambulatory Visit: Payer: Self-pay | Admitting: Nurse Practitioner

## 2016-08-16 VITALS — BP 146/92 | HR 73 | Temp 98.8°F | Resp 18 | Ht 62.0 in | Wt 173.4 lb

## 2016-08-16 DIAGNOSIS — Z720 Tobacco use: Secondary | ICD-10-CM | POA: Diagnosis not present

## 2016-08-16 DIAGNOSIS — R040 Epistaxis: Secondary | ICD-10-CM | POA: Diagnosis not present

## 2016-08-16 DIAGNOSIS — I10 Essential (primary) hypertension: Secondary | ICD-10-CM | POA: Diagnosis not present

## 2016-08-16 DIAGNOSIS — F319 Bipolar disorder, unspecified: Secondary | ICD-10-CM | POA: Diagnosis not present

## 2016-08-16 DIAGNOSIS — N3281 Overactive bladder: Secondary | ICD-10-CM

## 2016-08-16 DIAGNOSIS — K219 Gastro-esophageal reflux disease without esophagitis: Secondary | ICD-10-CM

## 2016-08-16 MED ORDER — CITALOPRAM HYDROBROMIDE 20 MG PO TABS: 20.0000 mg | ORAL_TABLET | Freq: Every day | ORAL | 1 refills | Status: DC

## 2016-08-16 MED ORDER — MIRABEGRON ER 25 MG PO TB24
25.0000 mg | ORAL_TABLET | Freq: Every day | ORAL | 1 refills | Status: AC
Start: 2016-08-16 — End: ?

## 2016-08-16 MED ORDER — TRIAMCINOLONE ACETONIDE 0.1 % EX CREA: TOPICAL_CREAM | CUTANEOUS | 0 refills | Status: AC

## 2016-08-16 NOTE — Patient Instructions (Signed)
Omeprazole 20 mg by mouth daily for acid reflux/indigestion  Mucinex by mouth twice daily as needed congestion Mucinex DM by mouth twice daily as needed cough and congestion.   To restart myrbetriq 25 mg daily  Food Choices for Gastroesophageal Reflux Disease, Adult When you have gastroesophageal reflux disease (GERD), the foods you eat and your eating habits are very important. Choosing the right foods can help ease your discomfort. What guidelines do I need to follow?  Choose fruits, vegetables, whole grains, and low-fat dairy products.  Choose low-fat meat, fish, and poultry.  Limit fats such as oils, salad dressings, butter, nuts, and avocado.  Keep a food diary. This helps you identify foods that cause symptoms.  Avoid foods that cause symptoms. These may be different for everyone.  Eat small meals often instead of 3 large meals a day.  Eat your meals slowly, in a place where you are relaxed.  Limit fried foods.  Cook foods using methods other than frying.  Avoid drinking alcohol.  Avoid drinking large amounts of liquids with your meals.  Avoid bending over or lying down until 2-3 hours after eating. What foods are not recommended? These are some foods and drinks that may make your symptoms worse: Vegetables Tomatoes. Tomato juice. Tomato and spaghetti sauce. Chili peppers. Onion and garlic. Horseradish. Fruits Oranges, grapefruit, and lemon (fruit and juice). Meats High-fat meats, fish, and poultry. This includes hot dogs, ribs, ham, sausage, salami, and bacon. Dairy Whole milk and chocolate milk. Sour cream. Cream. Butter. Ice cream. Cream cheese. Drinks Coffee and tea. Bubbly (carbonated) drinks or energy drinks. Condiments Hot sauce. Barbecue sauce. Sweets/Desserts Chocolate and cocoa. Donuts. Peppermint and spearmint. Fats and Oils High-fat foods. This includes [place] fries and potato chips. Other Vinegar. Strong spices. This includes black pepper, white  pepper, red pepper, cayenne, curry powder, cloves, ginger, and chili powder. The items listed above may not be a complete list of foods and drinks to avoid. Contact your dietitian for more information. This information is not intended to replace advice given to you by your health care provider. Make sure you discuss any questions you have with your health care provider. Document Released: 08/16/2011 Document Revised: 07/23/2015 Document Reviewed: 12/19/2012 Elsevier Interactive Patient Education  2017 [employer].

## 2016-08-16 NOTE — Progress Notes (Signed)
Careteam: Patient Care Team: Gayland Curry, DO as PCP - General (Geriatric Medicine) Norma Fredrickson, MD as Consulting Physician (Psychiatry) Harriett Sine, MD as Consulting Physician (Dermatology)  Advanced Directive information Does Patient Have a Medical Advance Directive?: Yes, Type of Advance Directive: Out of facility DNR (pink MOST or yellow form), Pre-existing out of facility DNR order (yellow form or pink MOST form): Yellow form placed in chart (order not valid for inpatient use)  Allergies  Allergen Reactions  . Latex Rash  . Powders [Starch] Rash    Talcum powder, has corn starch allergy  . Sulfur     Unable to recall type pf reaction   . Nickel     Chief Complaint  Patient presents with  . Follow-up    Pt is being seen for a 2 week follow up on BP, mood, and OAB.      HPI: Patient is a 71 y.o. female seen in the office today to follow up hypertension, mood and nose bleeds.  At last visit bystolic was increased to 20 mg. She was also to stop HCTZ which was thought to be adding to her OAB symptoms. Blood pressure better at home. SBP staying <120. Still having OAB even though she stopped HCTZ  She has not had any recurrent epistaxis.   Pt also reported her mood was poor. Had not been taking prozac but was on wellbutrin, lamictal, celexa and clonopin   Plans to move back to [facility] in the fall. Has more family there. Put in a transfer for [employer] 8 housing. Plans to see psychiatrist there.   Notes cough and congestion at times. Notes congestion at night. No shortness of breath. Noted occasional congestion.  Review of Systems:  Review of Systems  Constitutional: Negative for chills, fever and malaise/fatigue.  HENT: Negative for congestion, hearing loss and nosebleeds.   Eyes: Negative for blurred vision.  Respiratory: Negative for cough and shortness of breath.   Cardiovascular: Negative for chest pain, palpitations and leg swelling.  Gastrointestinal:  Positive for heartburn. Negative for abdominal pain, blood in stool, constipation, diarrhea and melena.  Genitourinary: Positive for frequency and urgency. Negative for dysuria, flank pain and hematuria.  Musculoskeletal: Positive for joint pain. Negative for falls.  Skin: Negative for itching and rash.  Neurological: Negative for dizziness, loss of consciousness, weakness and headaches.  Psychiatric/Behavioral: Positive for depression. Negative for hallucinations, memory loss and substance abuse. The patient is nervous/anxious and has insomnia.        Tobacco abuse    Past Medical History:  Diagnosis Date  . Arthritis   . Depression   . Generalized headaches   . Hyperlipemia   . Hypertension    Past Surgical History:  Procedure Laterality Date  . ABDOMINAL HYSTERECTOMY  1980  . ANKLE FRACTURE SURGERY  2011   Left   . APPENDECTOMY  1980   Social History:   reports that she has been smoking Cigarettes.  She has a 15.00 pack-year smoking history. She has never used smokeless tobacco. She reports that she drinks alcohol. She reports that she does not use drugs.  Family History  Problem Relation Age of Onset  . Cancer Mother   . Heart disease Mother   . Hypertension Mother   . Hyperlipidemia Mother   . Depression Sister   . Depression Sister   . Diabetes Other        Grand-daughter  . Kidney disease Son        Kidney Transplant   .  Pancreatic disease Son        Pancrease Transplant     Medications: Patient's Medications  New Prescriptions   No medications on file  Previous Medications   ASPIRIN EC 81 MG TABLET    Take 1 tablet (81 mg total) by mouth daily.   ATORVASTATIN (LIPITOR) 80 MG TABLET    Take 1 tablet (80 mg total) by mouth daily. For cholesterol   BUPROPION (WELLBUTRIN XL) 300 MG 24 HR TABLET    Take 1 tablet (300 mg total) by mouth daily.   CHOLECALCIFEROL (VITAMIN D3) 2000 UNITS CAPSULE    Take 1 capsule (2,000 Units total) by mouth daily.   CITALOPRAM  (CELEXA) 10 MG TABLET    Take 1 tablet (10 mg total) by mouth daily.   CLONAZEPAM (KLONOPIN) 2 MG TABLET    Take 1 tablet (2 mg total) by mouth at bedtime. For Rest   LAMOTRIGINE (LAMICTAL) 25 MG TABLET    Take 1 tablet (25 mg total) by mouth daily.   NEBIVOLOL HCL (BYSTOLIC) 20 MG TABS    Take 1 tablet (20 mg total) by mouth daily.   TRIAMCINOLONE CREAM (KENALOG) 0.1 %    Apply topically to irritated skin daily as needed.   VALSARTAN (DIOVAN) 320 MG TABLET    Take 1 tablet (320 mg total) by mouth daily.  Modified Medications   No medications on file  Discontinued Medications   TRIAMCINOLONE CREAM (KENALOG) 0.1 %         Physical Exam:  Vitals:   08/16/16 0940  BP: (!) 146/92  Pulse: 73  Resp: 18  Temp: 98.8 F (37.1 C)  TempSrc: Oral  SpO2: 96%  Weight: 173 lb 6.4 oz (78.7 kg)  Height: 5\' 2"  (1.575 m)   Body mass index is 31.72 kg/m.  Physical Exam  Constitutional: She is oriented to person, place, and time. She appears well-developed and well-nourished. No distress.  Cardiovascular: Normal rate, regular rhythm and normal heart sounds.   Pulmonary/Chest: Effort normal and breath sounds normal. No respiratory distress.  Abdominal: Soft. Bowel sounds are normal.  Musculoskeletal: Normal range of motion.  Neurological: She is alert and oriented to person, place, and time.  Skin: Skin is warm and dry.  Psychiatric: She has a normal mood and affect.   Labs reviewed: Basic Metabolic Panel:  Recent Labs  08/01/16 1623  NA 141  K 3.3*  CL 109  CO2 20  GLUCOSE 73  BUN 13  CREATININE 0.83  CALCIUM 9.5   Liver Function Tests:  Recent Labs  08/01/16 1623  AST 13  ALT 13  ALKPHOS 89  BILITOT 0.4  PROT 6.7  ALBUMIN 4.1   No results for input(s): LIPASE, AMYLASE in the last 8760 hours. No results for input(s): AMMONIA in the last 8760 hours. CBC:  Recent Labs  08/01/16 1623  WBC 7.9  NEUTROABS 4,977  HGB 13.2  HCT 41.2  MCV 91.4  PLT 314   Lipid  Panel:  Recent Labs  09/18/15 0840 12/21/15 0904  CHOL 158 175  HDL 55 48  LDLCALC 83 93  TRIG 102 171*  CHOLHDL 2.9 3.6   TSH: No results for input(s): TSH in the last 8760 hours. A1C: Lab Results  Component Value Date   HGBA1C 5.6 12/21/2015     Assessment/Plan 1. Essential hypertension, benign Improved, reports sbp <120 at home. Smoked cigarette prior to coming into appt. To cont current regimen.   2. Bipolar 1 disorder ([place]) -reports she  stays depressed. Will increase celexa since she is off prozac at this time.  - citalopram (CELEXA) 20 MG tablet; Take 1 tablet (20 mg total) by mouth daily.  Dispense: 30 tablet; Refill: 1  3. OAB (overactive bladder) -conts to have symptoms despite stopping HCTZ - mirabegron ER (MYRBETRIQ) 25 MG TB24 tablet; Take 1 tablet (25 mg total) by mouth daily.  Dispense: 30 tablet; Refill: 1  4. Epistaxis resolved  5. Tobacco abuse Encouraged smoking cessation.  Has increase congestion- may use mucinex BID PRN  6. Gastroesophageal reflux disease without esophagitis Encouraged dietary modifications, May use omeprazole 20 mg daily    To keep follow up with Dr Mariea Clonts in July. Will need follow up on OAB (myrbetriq restarted), bipolar (increase in celexa) and GERD. Also with increase congestion in the evening which again reinforced the need to quit smoking.  Carlos American. Harle Battiest  [facility] [phone] 8 am - 5 pm) [phone] (after hours)

## 2016-09-01 ENCOUNTER — Ambulatory Visit: Payer: Self-pay | Admitting: Internal Medicine

## 2016-09-21 ENCOUNTER — Ambulatory Visit: Payer: Self-pay | Admitting: Internal Medicine

## 2016-09-22 ENCOUNTER — Other Ambulatory Visit: Payer: Medicare Other

## 2016-09-23 ENCOUNTER — Other Ambulatory Visit: Payer: Medicare Other

## 2016-09-29 ENCOUNTER — Ambulatory Visit: Payer: Medicare Other | Admitting: Internal Medicine

## 2016-10-06 ENCOUNTER — Telehealth: Payer: Self-pay | Admitting: *Deleted

## 2016-10-06 NOTE — Telephone Encounter (Signed)
Spoke with patient to ask if she was still Dr. Cyndi Lennert patient because she has canceled all her appts, per pt she didn't have money or transportation to come for appts. Spoke with patient and scheduled labs and appt with Dr. Mariea Clonts 10/10/2016. Also FYI per pt she will be moving to [facility] [place] in September.   Lipid and A1c already entered as future orders are those the labs you want?

## 2016-10-06 NOTE — Telephone Encounter (Signed)
-----   Message from Gayland Curry, DO sent at 10/06/2016  7:40 AM EDT ----- Please call patient and find out if she can come in for fasting labs before I see her next.  Also check next appt.  Thanks.

## 2016-10-06 NOTE — Telephone Encounter (Signed)
Yes, these labs are fine.  She had the rest the last visit.

## 2016-10-06 NOTE — Telephone Encounter (Signed)
Pt will get labs the same day as visit

## 2016-10-10 ENCOUNTER — Ambulatory Visit (INDEPENDENT_AMBULATORY_CARE_PROVIDER_SITE_OTHER): Payer: Medicare Other | Admitting: Internal Medicine

## 2016-10-10 ENCOUNTER — Encounter: Payer: Self-pay | Admitting: Internal Medicine

## 2016-10-10 VITALS — BP 138/70 | HR 64 | Temp 98.1°F | Wt 167.0 lb

## 2016-10-10 DIAGNOSIS — E785 Hyperlipidemia, unspecified: Secondary | ICD-10-CM | POA: Diagnosis not present

## 2016-10-10 DIAGNOSIS — F319 Bipolar disorder, unspecified: Secondary | ICD-10-CM

## 2016-10-10 DIAGNOSIS — R739 Hyperglycemia, unspecified: Secondary | ICD-10-CM | POA: Diagnosis not present

## 2016-10-10 DIAGNOSIS — Z716 Tobacco abuse counseling: Secondary | ICD-10-CM | POA: Diagnosis not present

## 2016-10-10 DIAGNOSIS — N3281 Overactive bladder: Secondary | ICD-10-CM

## 2016-10-10 DIAGNOSIS — I10 Essential (primary) hypertension: Secondary | ICD-10-CM | POA: Diagnosis not present

## 2016-10-10 DIAGNOSIS — Z23 Encounter for immunization: Secondary | ICD-10-CM | POA: Diagnosis not present

## 2016-10-10 MED ORDER — ZOSTER VAC RECOMB ADJUVANTED 50 MCG/0.5ML IM SUSR: 0.5000 mL | Freq: Once | INTRAMUSCULAR | 1 refills | Status: AC

## 2016-10-10 NOTE — Patient Instructions (Addendum)
Best of luck in [place]!  We'll miss you.  Ask your new primary doctor to request records from [place]. We are on EPIC also which helps with "care everywhere".  Try zyrtec or claritin for you allergies instead of benadryl and alka seltzer.  Saline nasal spray short term may also help.
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
## 2017-03-08 IMAGING — CR DG CERVICAL SPINE COMPLETE 4+V
6 series · 6 of 6 positions shown · non-contrast
Comparison: None.

CLINICAL DATA: Left neck pain for several months without known
injury.

EXAM:
CERVICAL SPINE  4+ VIEWS

[w c-spine lat]
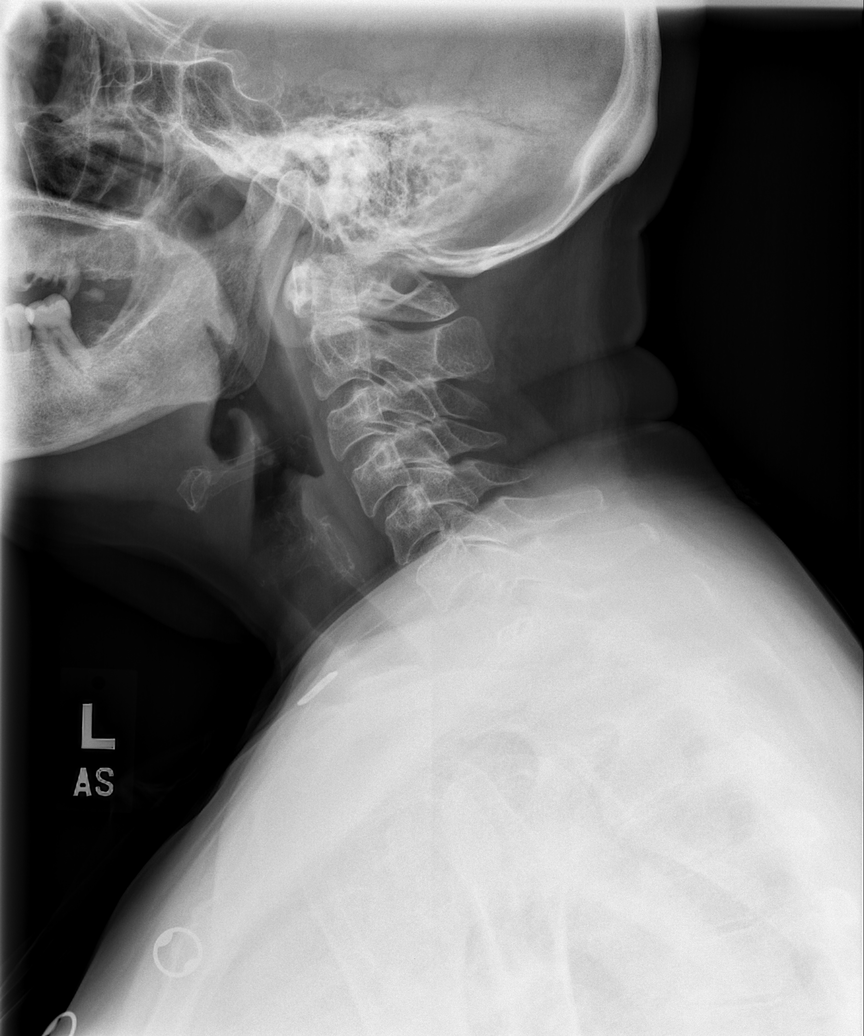

[w c-spine oblique (1 of 2)]
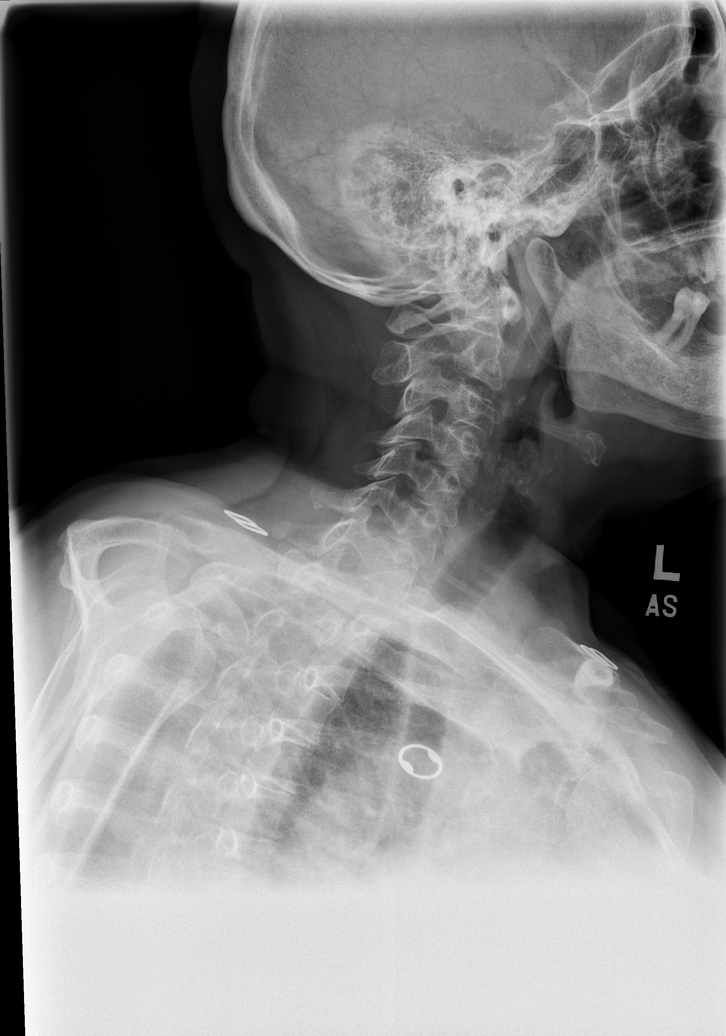

[w c-spine oblique (2 of 2)]
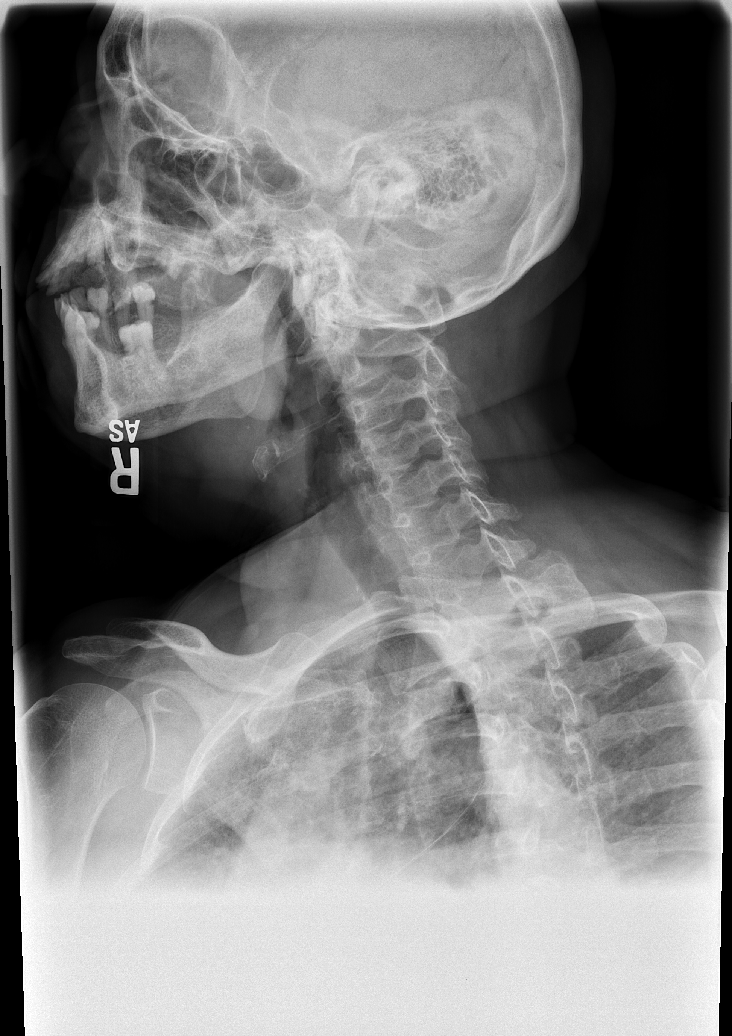

[w c-spine a.p. *]
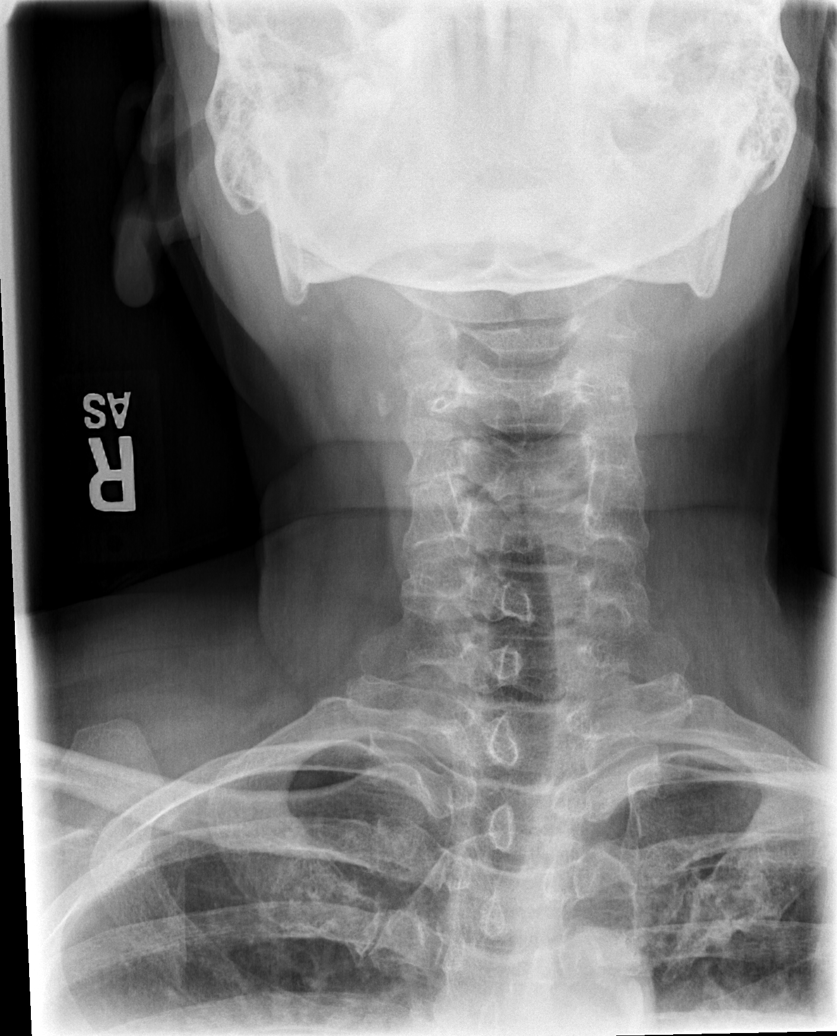

[w c-spine odontoid *]
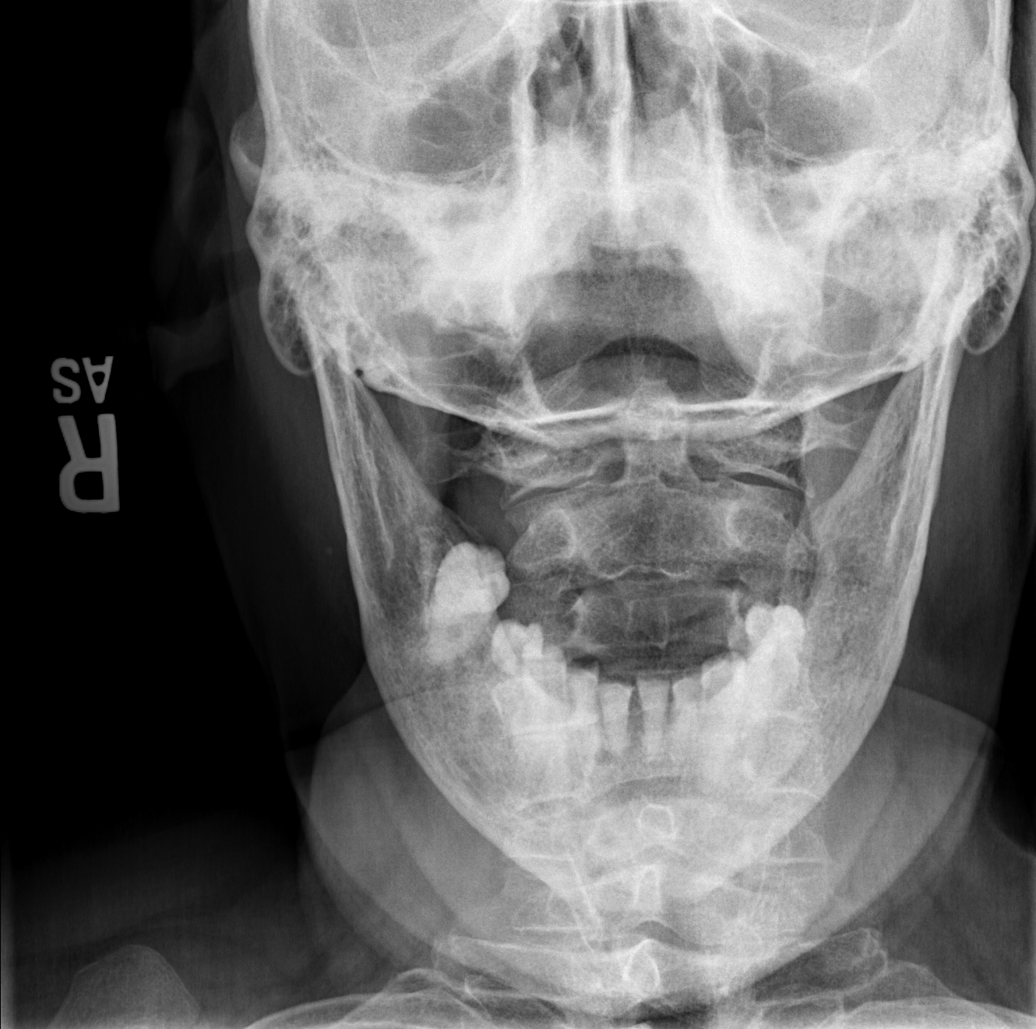

[w swimmers view *]
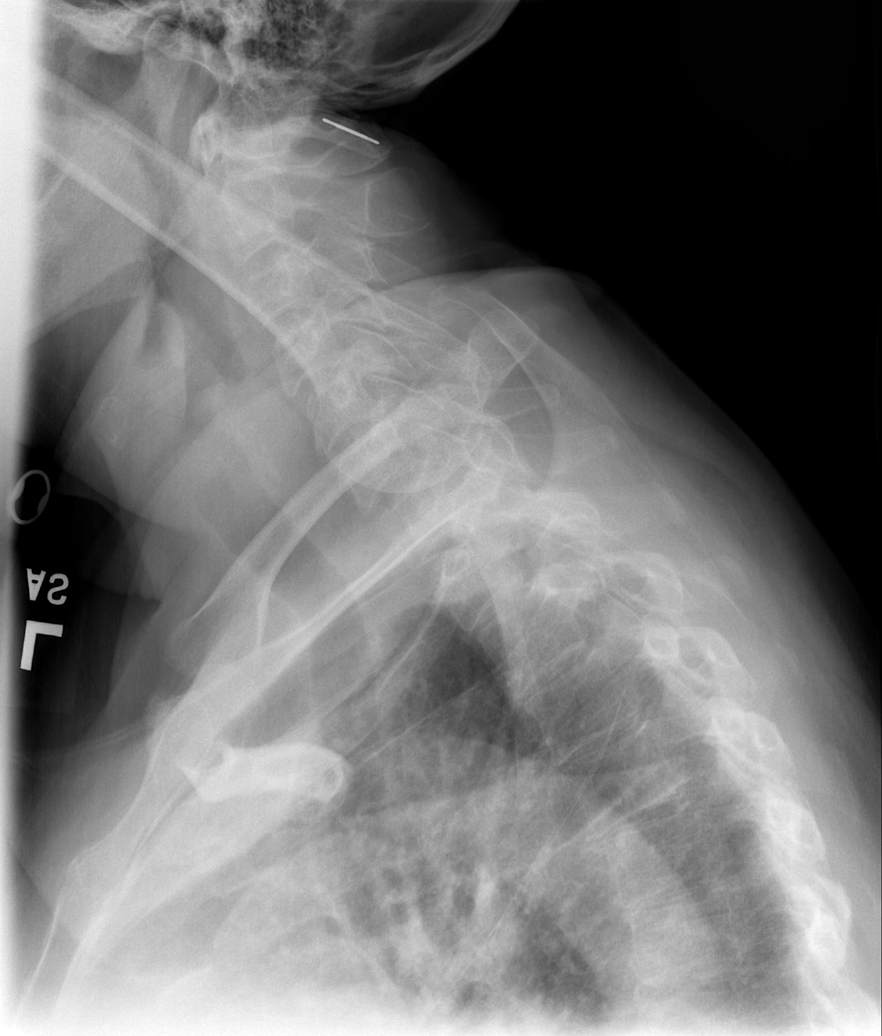

[6 of 6 positions shown; findings below may reference images not displayed]

FINDINGS: There is no evidence of cervical spine fracture or prevertebral soft
tissue swelling. Alignment is normal. No other significant bone
abnormalities are identified.
IMPRESSION: Negative cervical spine radiographs.
